# Patient Record
Sex: Male | Born: 1995 | Race: White | Hispanic: No | Marital: Single | Smoking: Never smoker
Health system: Southern US, Community
[De-identification: ages and names within clinical notes are randomized; demographics above are authoritative.]

## PROBLEM LIST (undated history)

## (undated) DIAGNOSIS — M24019 Loose body in unspecified shoulder: Secondary | ICD-10-CM

## (undated) DIAGNOSIS — M24112 Other articular cartilage disorders, left shoulder: Secondary | ICD-10-CM

## (undated) DIAGNOSIS — J3489 Other specified disorders of nose and nasal sinuses: Secondary | ICD-10-CM

## (undated) DIAGNOSIS — G43909 Migraine, unspecified, not intractable, without status migrainosus: Secondary | ICD-10-CM

## (undated) DIAGNOSIS — S43006A Unspecified dislocation of unspecified shoulder joint, initial encounter: Secondary | ICD-10-CM

## (undated) HISTORY — PX: THYROGLOSSAL DUCT CYST: SHX297

## (undated) HISTORY — PX: INGUINAL HERNIA REPAIR: SHX194

---
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
## 2016-04-21 DIAGNOSIS — M24112 Other articular cartilage disorders, left shoulder: Secondary | ICD-10-CM

## 2016-04-21 DIAGNOSIS — S43006A Unspecified dislocation of unspecified shoulder joint, initial encounter: Secondary | ICD-10-CM

## 2016-04-21 DIAGNOSIS — M24019 Loose body in unspecified shoulder: Secondary | ICD-10-CM

## 2016-04-21 HISTORY — DX: Unspecified dislocation of unspecified shoulder joint, initial encounter: S43.006A

## 2016-04-21 HISTORY — DX: Other articular cartilage disorders, left shoulder: M24.112

## 2016-04-21 HISTORY — DX: Loose body in unspecified shoulder: M24.019

## 2016-05-15 ENCOUNTER — Encounter (HOSPITAL_BASED_OUTPATIENT_CLINIC_OR_DEPARTMENT_OTHER): Payer: Self-pay | Admitting: *Deleted

## 2016-05-15 DIAGNOSIS — J3489 Other specified disorders of nose and nasal sinuses: Secondary | ICD-10-CM

## 2016-05-15 HISTORY — DX: Other specified disorders of nose and nasal sinuses: J34.89

## 2016-05-20 NOTE — H&P (Signed)
Douglas Dixon is a 20 year old, new patient to me, who comes to the office with concerns about his left shoulder.  He was slack lining over the weekend when he awkwardly fell and his shoulder popped out.  He got it back in.  He reached up overhead and it came out a second time and stayed out.  He was seen in the emergency room where he was given anesthesia and a closed reduction was performed.  [place]  He is here to discuss his shoulder dilemma.  No previous history of instability.  Some generalized paresthesias down his arm, but they seem to be improving.  Health upon review is otherwise unremarkable.  On no current medications.  He is allergic to penicillin and sulfa.  No history of diabetes.  Past surgical history positive for thyroglossal cyst excision, herniorrhaphy, tonsillectomy.  Family is significant for diabetes and hypertension.  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  OBJECTIVE: Height 5'8".  Weight 135 pounds.  Lungs clear to auscultation bilaterally.  Heart sounds normal. Exam of the left shoulder shows positive anterior apprehension.  Negative sulcus.  Upper extremity neurologically intact.  Exam of cervical spine unremarkable.    X-RAYS: Three views of the shoulder show no acute adverse bony features.  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  PLAN: At this point the definitive treatment includes left shoulder arthroscopic bankart reconstruction with excision loose body.  Risks, benefits and possible complications reviewed.  Rehab and recovery time discussed.  All questions were answered.  Paperwork complete.

## 2016-05-22 ENCOUNTER — Ambulatory Visit (HOSPITAL_BASED_OUTPATIENT_CLINIC_OR_DEPARTMENT_OTHER)
Admission: RE | Admit: 2016-05-22 | Discharge: 2016-05-23 | Disposition: A | Discharge status: Home / Self Care | Payer: BLUE CROSS/BLUE SHIELD | Source: Ambulatory Visit | Attending: Orthopedic Surgery | Admitting: Orthopedic Surgery

## 2016-05-22 ENCOUNTER — Encounter (HOSPITAL_BASED_OUTPATIENT_CLINIC_OR_DEPARTMENT_OTHER): Payer: Self-pay | Admitting: Anesthesiology

## 2016-05-22 ENCOUNTER — Ambulatory Visit (HOSPITAL_BASED_OUTPATIENT_CLINIC_OR_DEPARTMENT_OTHER): Payer: BLUE CROSS/BLUE SHIELD | Admitting: Anesthesiology

## 2016-05-22 ENCOUNTER — Encounter (HOSPITAL_BASED_OUTPATIENT_CLINIC_OR_DEPARTMENT_OTHER)
Admission: RE | Disposition: A | Discharge status: Home / Self Care | Payer: Self-pay | Source: Ambulatory Visit | Attending: Orthopedic Surgery

## 2016-05-22 DIAGNOSIS — Z880 Allergy status to penicillin: Secondary | ICD-10-CM | POA: Insufficient documentation

## 2016-05-22 DIAGNOSIS — M21822 Other specified acquired deformities of left upper arm: Secondary | ICD-10-CM | POA: Diagnosis present

## 2016-05-22 DIAGNOSIS — M24412 Recurrent dislocation, left shoulder: Secondary | ICD-10-CM | POA: Diagnosis not present

## 2016-05-22 DIAGNOSIS — S42292A Other displaced fracture of upper end of left humerus, initial encounter for closed fracture: Secondary | ICD-10-CM | POA: Diagnosis present

## 2016-05-22 DIAGNOSIS — W19XXXA Unspecified fall, initial encounter: Secondary | ICD-10-CM | POA: Insufficient documentation

## 2016-05-22 DIAGNOSIS — S43085A Other dislocation of left shoulder joint, initial encounter: Secondary | ICD-10-CM | POA: Diagnosis present

## 2016-05-22 HISTORY — DX: Migraine, unspecified, not intractable, without status migrainosus: G43.909

## 2016-05-22 HISTORY — DX: Loose body in unspecified shoulder: M24.019

## 2016-05-22 HISTORY — DX: Other articular cartilage disorders, left shoulder: M24.112

## 2016-05-22 HISTORY — DX: Other specified disorders of nose and nasal sinuses: J34.89

## 2016-05-22 HISTORY — PX: CHONDROPLASTY: SHX5177

## 2016-05-22 HISTORY — DX: Unspecified dislocation of unspecified shoulder joint, initial encounter: S43.006A

## 2016-05-22 HISTORY — PX: SHOULDER ARTHROSCOPY WITH BANKART REPAIR: SHX5673

## 2016-05-22 SURGERY — SHOULDER ARTHROSCOPY WITH BANKART REPAIR
Anesthesia: General | Site: Shoulder | Laterality: Left

## 2016-05-22 MED ORDER — HYDROMORPHONE HCL 1 MG/ML IJ SOLN
0.2500 mg | INTRAMUSCULAR | Status: DC | PRN
  Administered 2016-05-22 (×4): 0.5 mg via INTRAVENOUS

## 2016-05-22 MED ORDER — MIDAZOLAM HCL 2 MG/2ML IJ SOLN
1.0000 mg | INTRAMUSCULAR | Status: DC | PRN
  Administered 2016-05-22: 2 mg via INTRAVENOUS

## 2016-05-22 MED ORDER — SCOPOLAMINE 1 MG/3DAYS TD PT72: 1.0000 | MEDICATED_PATCH | Freq: Once | TRANSDERMAL | Status: DC | PRN

## 2016-05-22 MED ORDER — CHLORHEXIDINE GLUCONATE 4 % EX LIQD: 60.0000 mL | Freq: Once | CUTANEOUS | Status: DC

## 2016-05-22 MED ORDER — LACTATED RINGERS IV SOLN
INTRAVENOUS | Status: DC
  Administered 2016-05-22 (×2): via INTRAVENOUS

## 2016-05-22 MED ORDER — METOCLOPRAMIDE HCL 5 MG PO TABS: 5.0000 mg | ORAL_TABLET | Freq: Three times a day (TID) | ORAL | Status: DC | PRN

## 2016-05-22 MED ORDER — METHOCARBAMOL 500 MG PO TABS
500.0000 mg | ORAL_TABLET | Freq: Four times a day (QID) | ORAL | Status: DC | PRN
  Administered 2016-05-22 – 2016-05-23 (×3): 500 mg via ORAL
  Filled 2016-05-22 (×3): qty 1

## 2016-05-22 MED ORDER — ONDANSETRON HCL 4 MG PO TABS
4.0000 mg | ORAL_TABLET | Freq: Four times a day (QID) | ORAL | Status: DC | PRN
  Administered 2016-05-22: 4 mg via ORAL
  Filled 2016-05-22: qty 1

## 2016-05-22 MED ORDER — SUCCINYLCHOLINE CHLORIDE 20 MG/ML IJ SOLN
INTRAMUSCULAR | Status: DC | PRN
  Administered 2016-05-22: 120 mg via INTRAVENOUS

## 2016-05-22 MED ORDER — OXYCODONE HCL 5 MG/5ML PO SOLN: 5.0000 mg | Freq: Once | ORAL | Status: DC | PRN

## 2016-05-22 MED ORDER — OXYCODONE-ACETAMINOPHEN 5-325 MG PO TABS: 1.0000 | ORAL_TABLET | ORAL | Status: DC | PRN

## 2016-05-22 MED ORDER — CLINDAMYCIN PHOSPHATE 900 MG/50ML IV SOLN
INTRAVENOUS | Status: AC
  Filled 2016-05-22: qty 50

## 2016-05-22 MED ORDER — MIDAZOLAM HCL 2 MG/2ML IJ SOLN
INTRAMUSCULAR | Status: AC
  Filled 2016-05-22: qty 2

## 2016-05-22 MED ORDER — ESMOLOL HCL 100 MG/10ML IV SOLN
INTRAVENOUS | Status: AC
  Filled 2016-05-22: qty 10

## 2016-05-22 MED ORDER — LIDOCAINE 2% (20 MG/ML) 5 ML SYRINGE
INTRAMUSCULAR | Status: DC | PRN
  Administered 2016-05-22: 100 mg via INTRAVENOUS

## 2016-05-22 MED ORDER — SUCCINYLCHOLINE CHLORIDE 200 MG/10ML IV SOSY
PREFILLED_SYRINGE | INTRAVENOUS | Status: AC
  Filled 2016-05-22: qty 10

## 2016-05-22 MED ORDER — SODIUM CHLORIDE 0.9 % IR SOLN
Status: DC | PRN
  Administered 2016-05-22: 7500 mL

## 2016-05-22 MED ORDER — OXYCODONE-ACETAMINOPHEN 5-325 MG PO TABS
1.0000 | ORAL_TABLET | ORAL | Status: DC | PRN
  Administered 2016-05-22 – 2016-05-23 (×4): 2 via ORAL
  Filled 2016-05-22 (×4): qty 2

## 2016-05-22 MED ORDER — PROPOFOL 10 MG/ML IV BOLUS
INTRAVENOUS | Status: DC | PRN
  Administered 2016-05-22: 200 mg via INTRAVENOUS

## 2016-05-22 MED ORDER — BUPIVACAINE HCL (PF) 0.5 % IJ SOLN
INTRAMUSCULAR | Status: AC
  Filled 2016-05-22: qty 30

## 2016-05-22 MED ORDER — METOCLOPRAMIDE HCL 5 MG/ML IJ SOLN: 5.0000 mg | Freq: Three times a day (TID) | INTRAMUSCULAR | Status: DC | PRN

## 2016-05-22 MED ORDER — ONDANSETRON HCL 4 MG PO TABS: 4.0000 mg | ORAL_TABLET | Freq: Four times a day (QID) | ORAL | Status: DC | PRN

## 2016-05-22 MED ORDER — OXYCODONE HCL 5 MG PO TABS: 5.0000 mg | ORAL_TABLET | Freq: Once | ORAL | Status: DC | PRN

## 2016-05-22 MED ORDER — ESMOLOL HCL 100 MG/10ML IV SOLN
INTRAVENOUS | Status: DC | PRN
  Administered 2016-05-22 (×2): 20 mg via INTRAVENOUS

## 2016-05-22 MED ORDER — ONDANSETRON HCL 4 MG/2ML IJ SOLN
INTRAMUSCULAR | Status: AC
  Filled 2016-05-22: qty 2

## 2016-05-22 MED ORDER — HYDROMORPHONE HCL 1 MG/ML IJ SOLN: 0.5000 mg | INTRAMUSCULAR | Status: DC | PRN

## 2016-05-22 MED ORDER — ONDANSETRON HCL 4 MG/2ML IJ SOLN
INTRAMUSCULAR | Status: DC | PRN
  Administered 2016-05-22: 4 mg via INTRAVENOUS

## 2016-05-22 MED ORDER — DEXAMETHASONE SODIUM PHOSPHATE 10 MG/ML IJ SOLN
INTRAMUSCULAR | Status: AC
  Filled 2016-05-22: qty 1

## 2016-05-22 MED ORDER — FENTANYL CITRATE (PF) 100 MCG/2ML IJ SOLN
50.0000 ug | INTRAMUSCULAR | Status: DC | PRN
  Administered 2016-05-22: 100 ug via INTRAVENOUS

## 2016-05-22 MED ORDER — KETOROLAC TROMETHAMINE 30 MG/ML IJ SOLN
INTRAMUSCULAR | Status: DC | PRN
  Administered 2016-05-22: 30 mg via INTRAVENOUS

## 2016-05-22 MED ORDER — ONDANSETRON HCL 4 MG/2ML IJ SOLN: 4.0000 mg | Freq: Four times a day (QID) | INTRAMUSCULAR | Status: DC | PRN

## 2016-05-22 MED ORDER — KETOROLAC TROMETHAMINE 30 MG/ML IJ SOLN
INTRAMUSCULAR | Status: AC
  Filled 2016-05-22: qty 1

## 2016-05-22 MED ORDER — FENTANYL CITRATE (PF) 100 MCG/2ML IJ SOLN
INTRAMUSCULAR | Status: AC
  Filled 2016-05-22: qty 2

## 2016-05-22 MED ORDER — HYDROMORPHONE HCL 1 MG/ML IJ SOLN
INTRAMUSCULAR | Status: AC
  Filled 2016-05-22: qty 1

## 2016-05-22 MED ORDER — BUPIVACAINE HCL (PF) 0.5 % IJ SOLN
INTRAMUSCULAR | Status: DC | PRN
  Administered 2016-05-22: 20 mL via INTRA_ARTICULAR

## 2016-05-22 MED ORDER — METHOCARBAMOL 1000 MG/10ML IJ SOLN: 500.0000 mg | Freq: Four times a day (QID) | INTRAVENOUS | Status: DC | PRN

## 2016-05-22 MED ORDER — CLINDAMYCIN PHOSPHATE 900 MG/50ML IV SOLN
900.0000 mg | INTRAVENOUS | Status: AC
  Administered 2016-05-22: 900 mg via INTRAVENOUS

## 2016-05-22 MED ORDER — CEFAZOLIN SODIUM-DEXTROSE 2-4 GM/100ML-% IV SOLN
INTRAVENOUS | Status: AC
  Filled 2016-05-22: qty 100

## 2016-05-22 MED ORDER — SODIUM CHLORIDE 0.9 % IV SOLN: INTRAVENOUS | Status: DC

## 2016-05-22 MED ORDER — GLYCOPYRROLATE 0.2 MG/ML IJ SOLN: 0.2000 mg | Freq: Once | INTRAMUSCULAR | Status: DC | PRN

## 2016-05-22 MED ORDER — LACTATED RINGERS IV SOLN: INTRAVENOUS | Status: DC

## 2016-05-22 MED ORDER — LIDOCAINE 2% (20 MG/ML) 5 ML SYRINGE
INTRAMUSCULAR | Status: AC
  Filled 2016-05-22: qty 5

## 2016-05-22 MED ORDER — SODIUM CHLORIDE 0.9 % IJ SOLN
3.0000 mL | Freq: Three times a day (TID) | INTRAMUSCULAR | Status: DC
Start: 2016-05-22 — End: 2016-05-23
  Administered 2016-05-22: 3 mL via INTRAVENOUS
  Filled 2016-05-22: qty 10

## 2016-05-22 MED ORDER — ONDANSETRON HCL 4 MG PO TABS: 4.0000 mg | ORAL_TABLET | Freq: Three times a day (TID) | ORAL | Status: DC | PRN

## 2016-05-22 SURGICAL SUPPLY — 99 items
ANCH SUT SHRT 12.5 CANN EYLT (Anchor) ×3 IMPLANT
ANCHOR SUT BIOCOMP LK 2.9X12.5 (Anchor) ×6 IMPLANT
APL SKNCLS STERI-STRIP NONHPOA (GAUZE/BANDAGES/DRESSINGS)
BAG DECANTER FOR FLEXI CONT (MISCELLANEOUS) IMPLANT
BANDAGE ACE 6X5 VEL STRL LF (GAUZE/BANDAGES/DRESSINGS) IMPLANT
BENZOIN TINCTURE PRP APPL 2/3 (GAUZE/BANDAGES/DRESSINGS) IMPLANT
BLADE CUTTER GATOR 3.5 (BLADE) ×3 IMPLANT
BLADE CUTTER MENIS 5.5 (BLADE) IMPLANT
BLADE GREAT WHITE 4.2 (BLADE) ×2 IMPLANT
BLADE GREAT WHITE 4.2MM (BLADE) ×1
BLADE MINI RND TIP GREEN BEAV (BLADE) IMPLANT
BLADE SURG 10 STRL SS (BLADE) ×1 IMPLANT
BLADE SURG 15 STRL LF DISP TIS (BLADE) ×1 IMPLANT
BLADE SURG 15 STRL SS (BLADE)
BUR OVAL 6.0 (BURR) ×1 IMPLANT
CANNULA 5.75X71 LONG (CANNULA) IMPLANT
CANNULA DRY DOC 8X75 (CANNULA) IMPLANT
CANNULA TWIST IN 8.25X7CM (CANNULA) ×2 IMPLANT
CANNULA TWIST IN 8.25X9CM (CANNULA) IMPLANT
CLEANER CAUTERY TIP 5X5 PAD (MISCELLANEOUS) ×1 IMPLANT
CLOSURE WOUND 1/2 X4 (GAUZE/BANDAGES/DRESSINGS)
CUFF TOURNIQUET SINGLE 34IN LL (TOURNIQUET CUFF) IMPLANT
DECANTER SPIKE VIAL GLASS SM (MISCELLANEOUS) ×2 IMPLANT
DRAPE EXTREMITY T 121X128X90 (DRAPE) ×3 IMPLANT
DRAPE STERI 35X30 U-POUCH (DRAPES) ×3 IMPLANT
DRAPE SURG 17X23 STRL (DRAPES) IMPLANT
DRAPE U-SHAPE 47X51 STRL (DRAPES) ×3 IMPLANT
DRAPE U-SHAPE 76X120 STRL (DRAPES) ×6 IMPLANT
DRSG PAD ABDOMINAL 8X10 ST (GAUZE/BANDAGES/DRESSINGS) ×3 IMPLANT
DURAPREP 26ML APPLICATOR (WOUND CARE) ×3 IMPLANT
ELECT MENISCUS 165MM 90D (ELECTRODE) ×3 IMPLANT
ELECT REM PT RETURN 9FT ADLT (ELECTROSURGICAL) ×3
ELECTRODE REM PT RTRN 9FT ADLT (ELECTROSURGICAL) ×1 IMPLANT
GAUZE SPONGE 4X4 12PLY STRL (GAUZE/BANDAGES/DRESSINGS) ×5 IMPLANT
GAUZE SPONGE 4X4 16PLY XRAY LF (GAUZE/BANDAGES/DRESSINGS) IMPLANT
GAUZE XEROFORM 1X8 LF (GAUZE/BANDAGES/DRESSINGS) ×3 IMPLANT
GLOVE BIOGEL PI IND STRL 7.0 (GLOVE) ×1 IMPLANT
GLOVE BIOGEL PI INDICATOR 7.0 (GLOVE) ×6
GLOVE ECLIPSE 6.5 STRL STRAW (GLOVE) ×2 IMPLANT
GLOVE ECLIPSE 7.0 STRL STRAW (GLOVE) ×3 IMPLANT
GLOVE SURG ORTHO 8.0 STRL STRW (GLOVE) ×3 IMPLANT
GOWN STRL REUS W/ TWL LRG LVL3 (GOWN DISPOSABLE) ×2 IMPLANT
GOWN STRL REUS W/ TWL XL LVL3 (GOWN DISPOSABLE) ×1 IMPLANT
GOWN STRL REUS W/TWL LRG LVL3 (GOWN DISPOSABLE) ×6
GOWN STRL REUS W/TWL XL LVL3 (GOWN DISPOSABLE) ×3
IMMOBILIZER SHOULDER FOAM XLGE (SOFTGOODS) IMPLANT
KIT PUSHLOCK 2.9 HIP (KITS) ×2 IMPLANT
LASSO 90 CVE QUICKPAS (DISPOSABLE) ×5 IMPLANT
LOOP 2 FIBERLINK CLOSED (SUTURE) IMPLANT
MANIFOLD NEPTUNE II (INSTRUMENTS) ×3 IMPLANT
NDL MAYO TROCAR (NEEDLE) IMPLANT
NDL SUT 6 .5 CRC .975X.05 MAYO (NEEDLE) IMPLANT
NEEDLE MAYO TAPER (NEEDLE)
NEEDLE MAYO TROCAR (NEEDLE) IMPLANT
NS IRRIG 1000ML POUR BTL (IV SOLUTION) ×3 IMPLANT
PACK ARTHROSCOPY DSU (CUSTOM PROCEDURE TRAY) ×3 IMPLANT
PACK BASIN DAY SURGERY FS (CUSTOM PROCEDURE TRAY) ×3 IMPLANT
PAD CLEANER CAUTERY TIP 5X5 (MISCELLANEOUS) ×2
PADDING CAST COTTON 6X4 STRL (CAST SUPPLIES) IMPLANT
PENCIL BUTTON HOLSTER BLD 10FT (ELECTRODE) ×3 IMPLANT
SET ARTHROSCOPY TUBING (MISCELLANEOUS) ×3
SET ARTHROSCOPY TUBING LN (MISCELLANEOUS) ×1 IMPLANT
SLEEVE SCD COMPRESS KNEE MED (MISCELLANEOUS) IMPLANT
SLING ARM FOAM STRAP LRG (SOFTGOODS) IMPLANT
SLING ARM IMMOBILIZER LRG (SOFTGOODS) ×2 IMPLANT
SLING ARM IMMOBILIZER MED (SOFTGOODS) IMPLANT
SLING ARM MED ADULT FOAM STRAP (SOFTGOODS) IMPLANT
SLING ARM XL FOAM STRAP (SOFTGOODS) IMPLANT
SPONGE LAP 18X18 X RAY DECT (DISPOSABLE) ×3 IMPLANT
SPONGE LAP 4X18 X RAY DECT (DISPOSABLE) ×3 IMPLANT
STAPLER VISISTAT 35W (STAPLE) IMPLANT
STRIP CLOSURE SKIN 1/2X4 (GAUZE/BANDAGES/DRESSINGS) IMPLANT
SUCTION FRAZIER HANDLE 10FR (MISCELLANEOUS)
SUCTION TUBE FRAZIER 10FR DISP (MISCELLANEOUS) IMPLANT
SUT ETHIBOND 2 OS 4 DA (SUTURE) IMPLANT
SUT ETHILON 2 0 FS 18 (SUTURE) IMPLANT
SUT ETHILON 3 0 PS 1 (SUTURE) ×2 IMPLANT
SUT FIBERWIRE #2 38 T-5 BLUE (SUTURE)
SUT MNCRL AB 3-0 PS2 18 (SUTURE) IMPLANT
SUT RETRIEVER MED (INSTRUMENTS) IMPLANT
SUT VIC AB 0 CT1 27 (SUTURE)
SUT VIC AB 0 CT1 27XBRD ANBCTR (SUTURE) IMPLANT
SUT VIC AB 0 SH 27 (SUTURE) IMPLANT
SUT VIC AB 1 CT1 27 (SUTURE)
SUT VIC AB 1 CT1 27XBRD ANBCTR (SUTURE) IMPLANT
SUT VIC AB 2-0 SH 27 (SUTURE) ×6
SUT VIC AB 2-0 SH 27XBRD (SUTURE) ×2 IMPLANT
SUT VIC AB 3-0 FS2 27 (SUTURE) IMPLANT
SUTURE FIBERWR #2 38 T-5 BLUE (SUTURE) IMPLANT
SYR BULB 3OZ (MISCELLANEOUS) ×3 IMPLANT
TAPE HYPAFIX 4 X10 (GAUZE/BANDAGES/DRESSINGS) ×3 IMPLANT
TAPE LABRALWHITE 1.5X36 (TAPE) ×2 IMPLANT
TAPE SUT LABRALTAP WHT/BLK (SUTURE) ×4 IMPLANT
TOWEL OR 17X24 6PK STRL BLUE (TOWEL DISPOSABLE) ×3 IMPLANT
TUBE CONNECTING 20'X1/4 (TUBING)
TUBE CONNECTING 20X1/4 (TUBING) IMPLANT
UNDERPAD 30X30 (UNDERPADS AND DIAPERS) ×1 IMPLANT
WATER STERILE IRR 1000ML POUR (IV SOLUTION) ×3 IMPLANT
YANKAUER SUCT BULB TIP NO VENT (SUCTIONS) ×3 IMPLANT

## 2016-05-22 NOTE — Discharge Instructions (Signed)
Care After Instructions Refer to this sheet in the next few weeks. These discharge instructions provide you with general information on caring for yourself after you leave the hospital. Your caregiver may also give you specific instructions. Your treatment has been planned according to the most current medical practices available, but unavoidable complications sometimes occur. If you have any problems or questions after discharge, please call your caregiver. HOME INSTRUCTIONS You may resume a normal diet and activities as directed.  Take showers instead of baths until informed otherwise.  Change bandages (dressings) in 3 days.  Swab wounds daily with betadine.  Wash shoulder with soap and water.  Pat dry.  Cover wounds with bandaids. Only take over-the-counter or prescription medicines for pain, discomfort, or fever as directed by your caregiver.  Wear your sling for the next 6 weeks unless otherwise instructed. Eat a well-balanced diet.  Avoid lifting or driving until you are instructed otherwise.  Make an appointment to see your caregiver for stitches (suture) or staple removal one week after surgery.   SEEK MEDICAL CARE IF: You have swelling of your calf or leg.  You develop shortness of breath or chest pain.  You have redness, swelling, or increasing pain in the wound.  There is pus or any unusual drainage coming from the surgical site.  You notice a bad smell coming from the surgical site or dressing.  The surgical site breaks open after sutures or staples have been removed.  There is persistent bleeding from the suture or staple line.  You are getting worse or are not improving.  You have any other questions or concerns.  SEEK IMMEDIATE MEDICAL CARE IF:  You have a fever greater than 101 You develop a rash.  You have difficulty breathing.  You develop any reaction or side effects to medicines given.  Your knee motion is decreasing rather than improving.  MAKE SURE YOU:    Understand these instructions.  Will watch your condition.  Will get help right away if you are not doing well or get worse.     Post Anesthesia Home Care Instructions  Activity: Get plenty of rest for the remainder of the day. A responsible adult should stay with you for 24 hours following the procedure.  For the next 24 hours, DO NOT: -Drive a car -Advertising copywriterperate machinery -Drink alcoholic beverages -Take any medication unless instructed by your physician -Make any legal decisions or sign important papers.  Meals: Start with liquid foods such as gelatin or soup. Progress to regular foods as tolerated. Avoid greasy, spicy, heavy foods. If nausea and/or vomiting occur, drink only clear liquids until the nausea and/or vomiting subsides. Call your physician if vomiting continues.  Special Instructions/Symptoms: Your throat may feel dry or sore from the anesthesia or the breathing tube placed in your throat during surgery. If this causes discomfort, gargle with warm salt water. The discomfort should disappear within 24 hours.  If you had a scopolamine patch placed behind your ear for the management of post- operative nausea and/or vomiting:  1. The medication in the patch is effective for 72 hours, after which it should be removed.  Wrap patch in a tissue and discard in the trash. Wash hands thoroughly with soap and water. 2. You may remove the patch earlier than 72 hours if you experience unpleasant side effects which may include dry mouth, dizziness or visual disturbances. 3. Avoid touching the patch. Wash your hands with soap and water after contact with the patch.

## 2016-05-22 NOTE — Interval H&P Note (Signed)
History and Physical Interval Note:  05/22/2016 7:30 AM  Douglas Dixon  has presented today for surgery, with the diagnosis of Unspecified dislocation of unspecified shoulder joint, initial encounter left, other articular cartilage disorders, left shoulder, loose body in left shoulder  S43.006A, M24.112, M24.012  The various methods of treatment have been discussed with the patient and family. After consideration of risks, benefits and other options for treatment, the patient has consented to  Procedure(s): LEFT SHOULDER ARTHROSCOPY WITH DEBRIDEMENT, BANKART REPAIR (Left) LOOSE BODY EXCISION (Left) as a surgical intervention .  The patient's history has been reviewed, patient examined, no change in status, stable for surgery.  I have reviewed the patient's chart and labs.  Questions were answered to the patient's satisfaction.     Loreta Aveaniel F Ivet Guerrieri

## 2016-05-22 NOTE — Anesthesia Preprocedure Evaluation (Signed)
Anesthesia Evaluation  Patient identified by MRN, date of birth, ID band Patient awake    Reviewed: Allergy & Precautions, NPO status , Patient's Chart, lab work & pertinent test results  Airway Mallampati: II   Neck ROM: full    Dental   Pulmonary neg pulmonary ROS,  breath sounds clear to auscultation        Cardiovascular negative cardio ROS  Rhythm:regular Rate:Normal     Neuro/Psych  Headaches,    GI/Hepatic   Endo/Other    Renal/GU      Musculoskeletal   Abdominal   Peds  Hematology   Anesthesia Other Findings   Reproductive/Obstetrics                             Anesthesia Physical Anesthesia Plan  ASA: I  Anesthesia Plan: General   Post-op Pain Management:    Induction: Intravenous  Airway Management Planned: Oral ETT  Additional Equipment:   Intra-op Plan:   Post-operative Plan: Extubation in OR  Informed Consent: I have reviewed the patients History and Physical, chart, labs and discussed the procedure including the risks, benefits and alternatives for the proposed anesthesia with the patient or authorized representative who has indicated his/her understanding and acceptance.     Plan Discussed with: CRNA, Anesthesiologist and Surgeon  Anesthesia Plan Comments:         Anesthesia Quick Evaluation  

## 2016-05-22 NOTE — Anesthesia Postprocedure Evaluation (Signed)
Anesthesia Post Note  Patient: Douglas Dixon  Procedure(s) Performed: Procedure(s) (LRB): LEFT SHOULDER ARTHROSCOPY WITH DEBRIDEMENT, BANKART REPAIR, EXCISION OF LOOSE BODY, CHONDROPLASTY (Left) CHONDROPLASTY (Left)  Patient location during evaluation: PACU Anesthesia Type: General Level of consciousness: awake and alert and patient cooperative Pain management: pain level controlled Vital Signs Assessment: post-procedure vital signs reviewed and stable Respiratory status: spontaneous breathing and respiratory function stable Cardiovascular status: stable Anesthetic complications: no    Last Vitals:  Filed Vitals:   05/22/16 1330 05/22/16 1345  BP: 136/94 141/94  Pulse: 71 71  Temp:    Resp: 16 14    Last Pain:  Filed Vitals:   05/22/16 1358  PainSc: 5                  Maleigha Colvard S

## 2016-05-22 NOTE — Anesthesia Procedure Notes (Signed)
Procedure Name: Intubation Date/Time: 05/22/2016 11:33 AM Performed by: Burna CashONRAD, Ervie Mccard C Pre-anesthesia Checklist: Patient identified, Emergency Drugs available, Suction available and Patient being monitored Patient Re-evaluated:Patient Re-evaluated prior to inductionOxygen Delivery Method: Circle system utilized Preoxygenation: Pre-oxygenation with 100% oxygen Intubation Type: IV induction Ventilation: Mask ventilation without difficulty Laryngoscope Size: Miller and 2 Grade View: Grade I Tube type: Oral Tube size: 8.0 mm Number of attempts: 1 Airway Equipment and Method: Stylet and Oral airway Placement Confirmation: ETT inserted through vocal cords under direct vision,  positive ETCO2 and breath sounds checked- equal and bilateral Secured at: 21 cm Tube secured with: Tape Dental Injury: Teeth and Oropharynx as per pre-operative assessment

## 2016-05-22 NOTE — Transfer of Care (Signed)
Immediate Anesthesia Transfer of Care Note  Patient: Beatrice Lecherhillip C Dykema  Procedure(s) Performed: Procedure(s): LEFT SHOULDER ARTHROSCOPY WITH DEBRIDEMENT, BANKART REPAIR, EXCISION OF LOOSE BODY, CHONDROPLASTY (Left) CHONDROPLASTY (Left)  Patient Location: PACU  Anesthesia Type:General  Level of Consciousness: sedated  Airway & Oxygen Therapy: Patient Spontanous Breathing and Patient connected to face mask oxygen  Post-op Assessment: Report given to RN and Post -op Vital signs reviewed and stable  Post vital signs: Reviewed and stable  Last Vitals:  Filed Vitals:   05/22/16 1250 05/22/16 1251  BP:    Pulse: 81 82  Temp:    Resp:  13    Last Pain:  Filed Vitals:   05/22/16 1252  PainSc: 3          Complications: No apparent anesthesia complications

## 2016-05-23 ENCOUNTER — Encounter (HOSPITAL_BASED_OUTPATIENT_CLINIC_OR_DEPARTMENT_OTHER): Payer: Self-pay | Admitting: Orthopedic Surgery

## 2016-05-23 DIAGNOSIS — M24412 Recurrent dislocation, left shoulder: Secondary | ICD-10-CM | POA: Diagnosis not present

## 2016-05-23 NOTE — Op Note (Signed)
NAMKathrene Dixon:  Gordon, PHILIP                ACCOUNT NO.:  [id number]  MEDICAL RECORD NO.:  [id number]  LOCATION:                                 FACILITY:  PHYSICIAN:  Loreta Aveaniel F. Murphy, M.D. DATE OF BIRTH:  02/26/1996  DATE OF PROCEDURE:  05/22/2016 DATE OF DISCHARGE:                              OPERATIVE REPORT   PREOPERATIVE DIAGNOSES:  Left shoulder traumatic anteroinferior dislocation.  Bankart lesion and Hill-Sachs lesion.  Loose body.  POSTOPERATIVE DIAGNOSES:  Left shoulder traumatic anteroinferior dislocation.  Bankart lesion and Hill-Sachs lesion.  Loose body with one free loose body of cartilage in the gutter.  A partially-tethered chondral loose body in the front of the glenoid adjacent to his Bankart lesion.  A little bit of fraying and tearing at the inferior labrum.  PROCEDURES:  Left shoulder exam under anesthesia, arthroscopy.  Removal of two loose bodies.  Chondroplasty of humeral head and front of the glenoid.  Debridement of labrum.  Arthroscopic-assisted Bankart reconstruction.  FiberWeave sutures x3.  PushLock anchors x3.  SURGEON:  Loreta Aveaniel F. Murphy, M.D.  ASSISTANT:  Mikey KirschnerLindsey Stanberry, PA, was present throughout the entire case and necessary for timely completion of procedure.  ANESTHESIA:  General.  BLOOD LOSS:  Minimal.  SPECIMENS:  None.  CULTURES:  None.  COMPLICATIONS:  None.  DRESSING:  Soft compressive shoulder immobilizer.  DESCRIPTION OF PROCEDURE:  The patient was brought to the operating room and after adequate anesthesia had been obtained, left shoulder examined. Obvious anterior instability, subluxed just examining him.  Stable posteriorly.  Full motion.  Placed in beach-chair position on the shoulder positioner, prepped and draped in usual sterile fashion.  Two portals; anterior and posterior.  Arthroscope was introduced, shoulder was distended and inspected.  Obvious Bankart lesion from 6-11 o'clock position.  This was mobilized.  At the  front of the glenoid at the 8 and 9 o'clock position, there were two chondral flaps lifted up a little bit above and behind them.  One completely unstable, removed this loose body.  The other one I could entrap and put back in place and I did the reconstruction.  Debrided the inferior labrum.  Rotator cuff, biceps tendon all looked good.  Chondral loose body found on the gutter removed.  Chondroplasty to a stable surface of the Hill-Sachs lesion. Through the anterior portal, cannula placed.  Capsular labral interface was captured at 6, 8 and 10 o'clock positions with a Captain hook device and LabralTape suture.  Anchored down the glenoid with PushLock anchors of 7, 9 and 11 o'clock.  Nice, firm reconstruction.  The middle anchor was placed to entrap and pull down the chondral flap.  Grade stability on completion.  Instruments and fluids were removed.  Portals were closed with nylon.  Sterile compressive dressing applied.  Shoulder immobilizer applied.  Anesthesia reversed.  Brought to the recovery room.  Tolerated the surgery well.  No complications.     Loreta Aveaniel F. Murphy, M.D.   ______________________________ Loreta Aveaniel F. Murphy, M.D.    DFM/MEDQ  D:  05/22/2016  T:  05/23/2016  Job:  [id number]

## 2020-01-26 ENCOUNTER — Ambulatory Visit: Payer: BC Managed Care – PPO | Admitting: Orthopedic Surgery

## 2020-01-26 ENCOUNTER — Other Ambulatory Visit: Payer: Self-pay

## 2020-01-26 ENCOUNTER — Ambulatory Visit: Payer: Self-pay

## 2020-01-26 DIAGNOSIS — M25512 Pain in left shoulder: Secondary | ICD-10-CM

## 2020-01-26 DIAGNOSIS — M25312 Other instability, left shoulder: Secondary | ICD-10-CM

## 2020-01-27 ENCOUNTER — Encounter: Payer: Self-pay | Admitting: Orthopedic Surgery

## 2020-01-27 NOTE — Progress Notes (Signed)
Office Visit Note   Patient: Douglas Dixon           Date of Birth: 04-Apr-1996           MRN: [id number] Visit Date: 01/26/2020 Requested by: Lemmie Evens, MD [address],  [place] [address] PCP: Lemmie Evens, MD  Subjective: Chief Complaint  Patient presents with  . Left Shoulder - Pain    HPI: Douglas Dixon is a patient with left shoulder pain.  He sustained a dislocation which was treated with left shoulder arthroscopy and Bankart repair 617.  He did well with that until February 2020.  Dislocated his shoulder while doing a forward flexion type movement.  This occurred when he was in [place].  It is subsequently dislocated about 20 times since then.  He has been doing exercises at home to try to help.  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  He also does horse and veterinary work.  He is not a smoker.  He always is able to self reduce the shoulder dislocation.  He also describes some milder right shoulder symptoms but no instability.              ROS: All systems reviewed are negative as they relate to the chief complaint within the history of present illness.  Patient denies  fevers or chills.   Assessment & Plan: Visit Diagnoses:  1. Left shoulder pain, unspecified chronicity   2. Instability of left shoulder joint     Plan: Impression is left shoulder recurrent instability.  I reviewed the operative note and 3 anchors were utilized that the 6:00 8:00 and 10 o'clock position.  Plain radiographs today do not show much of a significant Hill-Sachs lesion but he may have a slight amount of bone loss off the anterior glenoid.  He is heading for more surgery and whether or not that repeat arthroscopic stabilization or open J type stabilization really depends on the amount of bone loss.  To that end we will need left shoulder MRI arthrogram to evaluate the capsular labral complex anteriorly.  Also needs CT scan left shoulder to  evaluate the amount of bone loss present on the glenoid.  See him back after that study.  He will need radiographs of the right shoulder on return as well.  Follow-Up Instructions: No follow-ups on file.   Orders:  Orders Placed This Encounter  Procedures  . XR Shoulder Left  . CT SHOULDER LEFT WO CONTRAST  . MR Shoulder Left w/ contrast  . Arthrogram   No orders of the defined types were placed in this encounter.     Procedures: No procedures performed   Clinical Data: No additional findings.  Objective: Vital Signs: There were no vitals taken for this visit.  Physical Exam:   Constitutional: Patient appears well-developed HEENT:  Head: Normocephalic Eyes:EOM are normal Neck: Normal range of motion Cardiovascular: Normal rate Pulmonary/chest: Effort normal Neurologic: Patient is alert Skin: Skin is warm Psychiatric: Patient has normal mood and affect    Ortho Exam: Ortho exam demonstrates full active and passive range of motion of the right shoulder.  Has a little bit of grinding in that right shoulder but good rotator cuff strength and no instability.  On the left shoulder there is less than a centimeter sulcus sign but definitely has anterior greater than posterior instability.  Rotator cuff strength is good infraspinatus supraspinatus and subscap muscle testing.  Not much in the way of coarse grinding or crepitus with active or passive range of motion of that left shoulder.  Specialty Comments:  No specialty comments available.  Imaging: No results found.   PMFS History: Patient Active Problem List   Diagnosis Date Noted  . 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 Loose body in joint of shoulder region 04/2016   left  . Migraines   . Shoulder joint dislocation 04/2016   left  . Stuffy and runny nose 05/15/2016   clear drainage from nose, per pt.      No family history on file.  Past Surgical History:  Procedure Laterality Date  . CHONDROPLASTY Left 05/22/2016   Procedure: CHONDROPLASTY;  Surgeon: Loreta Ave, MD;  Location: [facility];  Service: Orthopedics;  Laterality: Left;  . INGUINAL HERNIA REPAIR    . SHOULDER ARTHROSCOPY WITH BANKART REPAIR Left 05/22/2016   Procedure: LEFT SHOULDER ARTHROSCOPY WITH DEBRIDEMENT, BANKART REPAIR, EXCISION OF LOOSE BODY, CHONDROPLASTY;  Surgeon: Loreta Ave, MD;  Location: [facility];  Service: Orthopedics;  Laterality: Left;  . THYROGLOSSAL DUCT CYST    . TONSILLECTOMY AND ADENOIDECTOMY  06/11/2001   Social History   Occupational History  . Not on file  Tobacco Use  . Smoking status: Never Smoker  . Smokeless tobacco: Never Used  Substance and Sexual Activity  . Alcohol use: Yes    Comment: occasionally  . Drug use: No  . Sexual activity: Not on file

## 2020-01-27 NOTE — Progress Notes (Signed)
faxed

## 2020-03-08 ENCOUNTER — Other Ambulatory Visit: Payer: Self-pay

## 2020-03-08 ENCOUNTER — Ambulatory Visit
Admission: RE | Admit: 2020-03-08 | Discharge: 2020-03-08 | Disposition: A | Discharge status: Home / Self Care | Payer: BC Managed Care – PPO | Source: Ambulatory Visit | Attending: Orthopedic Surgery | Admitting: Orthopedic Surgery

## 2020-03-08 ENCOUNTER — Other Ambulatory Visit: Payer: Self-pay | Admitting: Orthopedic Surgery

## 2020-03-08 DIAGNOSIS — M25512 Pain in left shoulder: Secondary | ICD-10-CM

## 2020-03-08 DIAGNOSIS — Z77018 Contact with and (suspected) exposure to other hazardous metals: Secondary | ICD-10-CM

## 2020-03-08 DIAGNOSIS — M25312 Other instability, left shoulder: Secondary | ICD-10-CM

## 2020-03-08 MED ORDER — IOPAMIDOL (ISOVUE-M 200) INJECTION 41%
15.0000 mL | Freq: Once | INTRAMUSCULAR | Status: AC
  Administered 2020-03-08: 15 mL via INTRA_ARTICULAR

## 2020-03-09 ENCOUNTER — Encounter: Payer: Self-pay | Admitting: Orthopedic Surgery

## 2020-03-12 ENCOUNTER — Ambulatory Visit: Payer: BC Managed Care – PPO | Admitting: Orthopedic Surgery

## 2020-03-12 ENCOUNTER — Other Ambulatory Visit: Payer: Self-pay

## 2020-03-12 ENCOUNTER — Encounter: Payer: Self-pay | Admitting: Orthopedic Surgery

## 2020-03-12 DIAGNOSIS — M25312 Other instability, left shoulder: Secondary | ICD-10-CM

## 2020-03-15 ENCOUNTER — Encounter: Payer: Self-pay | Admitting: Orthopedic Surgery

## 2020-03-15 NOTE — Progress Notes (Signed)
Office Visit Note   Patient: Douglas Dixon           Date of Birth: 1996/11/03           MRN: [id number] Visit Date: 03/12/2020 Requested by: Gareth Morgan, MD [address],  [place] [address] PCP: Gareth Morgan, MD  Subjective: Chief Complaint  Patient presents with  . Follow-up    HPI: Douglas Dixon is a patient 24 years old here for review of CT and MRI scan.  He had about 20 dislocations since June 2020.  He had previous arthroscopic Bankart repair done elsewhere in town in 2017.  Did have a bony fragment of the glenoid at that time.  Instability is much more than pain in terms of his presenting symptoms.  Does have some paresthesias on that left-hand side in digits 4 and 5.  He has had recurrent symptoms in this region since his recurrent instability.  He does do physical work.  He works with horses and dogs.  He is not a smoker.  He also does snowboarding and other considerably high risk activities.  MRI scan is reviewed.  He does have bony Bankart which did not heal.  No haggle lesion.  Very mild Hill-Sachs lesion.  No significant engaging Hill-Sachs lesion in the glenoid bony defect.  All in all the bony defect size is difficult to quantify but he does have anterior inferior bone deficiency and recurrent instability with minimal exertion.              ROS: All systems reviewed are negative as they relate to the chief complaint within the history of present illness.  Patient denies  fevers or chills.   Assessment & Plan: Visit Diagnoses: No diagnosis found.  Plan: Impression is recurrent instability with glenoid bony defect and failure of previous arthroscopic Bankart repair.  Op note is reviewed.  3 anchors were utilized but only 2 are visible on the MRI scan.  The bony fragment is about 1/2 cm in length and does narrow the inferior half of the glenoid itself.  It does not look like he has an engaging Hill-Sachs lesion.  Rotator cuff strength is good.  Also has a SLAP tear.   Recurrent labral pathology is present.  Discussed at length with Douglas Dixon operative treatment options for this problem.  They would essentially be recurrent Bankart repair versus open J procedure through the subscap split.  I did tell him he would likely lose some range of motion with that latter procedure.  Risk of instability higher with revision arthroscopic Bankart in the face of some bony deficiency.  We talked a long time about the advantages and disadvantages of each procedure.  In the [place] decided that because instability was such a major problem he would prefer to have the procedure which would give him the best Dixon at not having recurrent instability.  That would be for him J procedure with biceps tenodesis.  Risk and benefits are discussed include not limited to infection nerve vessel damage hardware failure potential need for more surgery as well as recurrent instability shoulder stiffness and development of arthritis.  Patient understands risk benefits and wishes to proceed.  All questions answered.  Follow-Up Instructions: No follow-ups on file.   Orders:  No orders of the defined types were placed in this encounter.  No orders of the defined types were placed in this encounter.     Procedures: No procedures performed   Clinical Data: No additional findings.  Objective: Vital Signs: There were no vitals taken for this visit.  Physical Exam:   Constitutional: Patient appears well-developed HEENT:  Head: Normocephalic Eyes:EOM are normal Neck: Normal range of motion Cardiovascular: Normal rate Pulmonary/chest: Effort normal Neurologic: Patient is alert Skin: Skin is warm Psychiatric: Patient has normal mood and affect    Ortho Exam: Ortho exam demonstrates good rotator cuff strength infraspinatus supraspinatus and subscap muscle testing.  He does have some anterior apprehension but no sulcus sign.  External rotation of 15 degrees of abduction is about 75 degrees  bilaterally.  Forward flexion is full.  He does have less than a centimeter sulcus sign on the left and 2+ anterior instability 1+ posterior instability.  Specialty Comments:  No specialty comments available.  Imaging: No results found.   PMFS History: Patient Active Problem List   Diagnosis Date Noted  . 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 Loose body in joint of shoulder region 04/2016   left  . Migraines   . Shoulder joint dislocation 04/2016   left  . Stuffy and runny nose 05/15/2016   clear drainage from nose, per pt.    No family history on file.  Past Surgical History:  Procedure Laterality Date  . CHONDROPLASTY Left 05/22/2016   Procedure: CHONDROPLASTY;  Surgeon: Ninetta Lights, MD;  Location: [facility];  Service: Orthopedics;  Laterality: Left;  . INGUINAL HERNIA REPAIR    . SHOULDER ARTHROSCOPY WITH BANKART REPAIR Left 05/22/2016   Procedure: LEFT SHOULDER ARTHROSCOPY WITH DEBRIDEMENT, BANKART REPAIR, EXCISION OF LOOSE BODY, CHONDROPLASTY;  Surgeon: Ninetta Lights, MD;  Location: [facility];  Service: Orthopedics;  Laterality: Left;  . THYROGLOSSAL DUCT CYST    . TONSILLECTOMY AND ADENOIDECTOMY  06/11/2001   Social History   Occupational History  . Not on file  Tobacco Use  . Smoking status: Never Smoker  . Smokeless tobacco: Never Used  Substance and Sexual Activity  . Alcohol use: Yes    Comment: occasionally  . Drug use: No  . Sexual activity: Not on file

## 2020-04-05 ENCOUNTER — Other Ambulatory Visit: Payer: Self-pay

## 2020-04-10 ENCOUNTER — Encounter (HOSPITAL_BASED_OUTPATIENT_CLINIC_OR_DEPARTMENT_OTHER): Payer: Self-pay | Admitting: Orthopedic Surgery

## 2020-04-10 ENCOUNTER — Other Ambulatory Visit: Payer: Self-pay

## 2020-04-13 ENCOUNTER — Encounter (HOSPITAL_BASED_OUTPATIENT_CLINIC_OR_DEPARTMENT_OTHER)
Admission: RE | Admit: 2020-04-13 | Discharge: 2020-04-13 | Disposition: A | Discharge status: Home / Self Care | Payer: BC Managed Care – PPO | Source: Ambulatory Visit | Attending: Orthopedic Surgery | Admitting: Orthopedic Surgery

## 2020-04-13 DIAGNOSIS — Z01812 Encounter for preprocedural laboratory examination: Secondary | ICD-10-CM | POA: Diagnosis not present

## 2020-04-13 LAB — URINALYSIS, ROUTINE W REFLEX MICROSCOPIC
Bilirubin Urine: NEGATIVE
Glucose, UA: NEGATIVE mg/dL
Hgb urine dipstick: NEGATIVE
Ketones, ur: NEGATIVE mg/dL
Leukocytes,Ua: NEGATIVE
Nitrite: NEGATIVE
Protein, ur: NEGATIVE mg/dL
Specific Gravity, Urine: 1.005 (ref 1.005–1.030)
pH: 7 (ref 5.0–8.0)

## 2020-04-13 LAB — CBC
HCT: 46.8 % (ref 39.0–52.0)
Hemoglobin: 15.6 g/dL (ref 13.0–17.0)
MCH: 29.3 pg (ref 26.0–34.0)
MCHC: 33.3 g/dL (ref 30.0–36.0)
MCV: 88 fL (ref 80.0–100.0)
Platelets: 235 10*3/uL (ref 150–400)
RBC: 5.32 MIL/uL (ref 4.22–5.81)
RDW: 12.2 % (ref 11.5–15.5)
WBC: 4.7 10*3/uL (ref 4.0–10.5)
nRBC: 0 % (ref 0.0–0.2)

## 2020-04-13 LAB — BASIC METABOLIC PANEL
Anion gap: 8 (ref 5–15)
BUN: 10 mg/dL (ref 6–20)
CO2: 28 mmol/L (ref 22–32)
Calcium: 9.7 mg/dL (ref 8.9–10.3)
Chloride: 104 mmol/L (ref 98–111)
Creatinine, Ser: 1 mg/dL (ref 0.61–1.24)
GFR calc Af Amer: >60 mL/min (ref >60)
GFR calc non Af Amer: >60 mL/min (ref >60)
Glucose, Bld: 93 mg/dL (ref 70–99)
Potassium: 4.2 mmol/L (ref 3.5–5.1)
Sodium: 140 mmol/L (ref 135–145)

## 2020-04-13 NOTE — Progress Notes (Signed)

## 2020-04-14 LAB — URINE CULTURE: Culture: NO GROWTH

## 2020-04-16 ENCOUNTER — Other Ambulatory Visit (HOSPITAL_COMMUNITY)
Admission: RE | Admit: 2020-04-16 | Discharge: 2020-04-16 | Disposition: A | Discharge status: Home / Self Care | Payer: BC Managed Care – PPO | Source: Ambulatory Visit | Attending: Orthopedic Surgery | Admitting: Orthopedic Surgery

## 2020-04-16 ENCOUNTER — Other Ambulatory Visit: Payer: Self-pay

## 2020-04-16 DIAGNOSIS — Z20822 Contact with and (suspected) exposure to covid-19: Secondary | ICD-10-CM | POA: Diagnosis not present

## 2020-04-16 DIAGNOSIS — Z01812 Encounter for preprocedural laboratory examination: Secondary | ICD-10-CM | POA: Insufficient documentation

## 2020-04-17 ENCOUNTER — Other Ambulatory Visit: Payer: Self-pay

## 2020-04-17 ENCOUNTER — Other Ambulatory Visit (HOSPITAL_COMMUNITY)
Admission: RE | Admit: 2020-04-17 | Discharge: 2020-04-17 | Disposition: A | Discharge status: Home / Self Care | Payer: BC Managed Care – PPO | Source: Ambulatory Visit | Attending: Orthopedic Surgery | Admitting: Orthopedic Surgery

## 2020-04-17 LAB — SARS CORONAVIRUS 2 (TAT 6-24 HRS): SARS Coronavirus 2: NEGATIVE

## 2020-04-18 ENCOUNTER — Ambulatory Visit (HOSPITAL_BASED_OUTPATIENT_CLINIC_OR_DEPARTMENT_OTHER): Payer: BC Managed Care – PPO | Admitting: Certified Registered"

## 2020-04-18 ENCOUNTER — Ambulatory Visit (HOSPITAL_BASED_OUTPATIENT_CLINIC_OR_DEPARTMENT_OTHER)
Admission: RE | Admit: 2020-04-18 | Discharge: 2020-04-18 | Disposition: A | Discharge status: Home / Self Care | Payer: BC Managed Care – PPO | Attending: Orthopedic Surgery | Admitting: Orthopedic Surgery

## 2020-04-18 ENCOUNTER — Encounter (HOSPITAL_BASED_OUTPATIENT_CLINIC_OR_DEPARTMENT_OTHER): Payer: Self-pay | Admitting: Orthopedic Surgery

## 2020-04-18 ENCOUNTER — Other Ambulatory Visit: Payer: Self-pay

## 2020-04-18 ENCOUNTER — Encounter (HOSPITAL_BASED_OUTPATIENT_CLINIC_OR_DEPARTMENT_OTHER)
Admission: RE | Disposition: A | Discharge status: Home / Self Care | Payer: Self-pay | Source: Home / Self Care | Attending: Orthopedic Surgery

## 2020-04-18 DIAGNOSIS — Z882 Allergy status to sulfonamides status: Secondary | ICD-10-CM | POA: Diagnosis not present

## 2020-04-18 DIAGNOSIS — Z79899 Other long term (current) drug therapy: Secondary | ICD-10-CM | POA: Insufficient documentation

## 2020-04-18 DIAGNOSIS — Z791 Long term (current) use of non-steroidal anti-inflammatories (NSAID): Secondary | ICD-10-CM | POA: Diagnosis not present

## 2020-04-18 DIAGNOSIS — Z880 Allergy status to penicillin: Secondary | ICD-10-CM | POA: Diagnosis not present

## 2020-04-18 DIAGNOSIS — Z9889 Other specified postprocedural states: Secondary | ICD-10-CM | POA: Insufficient documentation

## 2020-04-18 DIAGNOSIS — G43909 Migraine, unspecified, not intractable, without status migrainosus: Secondary | ICD-10-CM | POA: Diagnosis not present

## 2020-04-18 DIAGNOSIS — M25312 Other instability, left shoulder: Secondary | ICD-10-CM | POA: Insufficient documentation

## 2020-04-18 DIAGNOSIS — S43432D Superior glenoid labrum lesion of left shoulder, subsequent encounter: Secondary | ICD-10-CM | POA: Diagnosis not present

## 2020-04-18 DIAGNOSIS — M8580 Other specified disorders of bone density and structure, unspecified site: Secondary | ICD-10-CM | POA: Insufficient documentation

## 2020-04-18 HISTORY — PX: SHOULDER LATERJET: SHX6528

## 2020-04-18 HISTORY — PX: SHOULDER ARTHROSCOPY WITH ROTATOR CUFF REPAIR AND OPEN BICEPS TENODESIS: SHX6677

## 2020-04-18 SURGERY — SHOULDER ARTHROSCOPY WITH ROTATOR CUFF REPAIR AND OPEN BICEPS TENODESIS
Anesthesia: General | Site: Shoulder | Laterality: Left

## 2020-04-18 MED ORDER — TRANEXAMIC ACID-NACL 1000-0.7 MG/100ML-% IV SOLN
1000.0000 mg | INTRAVENOUS | Status: AC
  Administered 2020-04-18: 1000 mg via INTRAVENOUS

## 2020-04-18 MED ORDER — VANCOMYCIN HCL 1000 MG IV SOLR
INTRAVENOUS | Status: AC
  Filled 2020-04-18: qty 1000

## 2020-04-18 MED ORDER — PROPOFOL 10 MG/ML IV BOLUS
INTRAVENOUS | Status: AC
  Filled 2020-04-18: qty 20

## 2020-04-18 MED ORDER — EPINEPHRINE PF 1 MG/ML IJ SOLN
INTRAMUSCULAR | Status: AC
  Filled 2020-04-18: qty 2

## 2020-04-18 MED ORDER — SUCCINYLCHOLINE CHLORIDE 20 MG/ML IJ SOLN
INTRAMUSCULAR | Status: DC | PRN
  Administered 2020-04-18: 100 mg via INTRAVENOUS

## 2020-04-18 MED ORDER — BUPIVACAINE HCL (PF) 0.5 % IJ SOLN
INTRAMUSCULAR | Status: DC | PRN
Start: 2020-04-18 — End: 2020-04-18
  Administered 2020-04-18: 20 mL via PERINEURAL

## 2020-04-18 MED ORDER — VANCOMYCIN HCL IN DEXTROSE 1-5 GM/200ML-% IV SOLN
1000.0000 mg | INTRAVENOUS | Status: AC
  Administered 2020-04-18: 1000 mg via INTRAVENOUS

## 2020-04-18 MED ORDER — FENTANYL CITRATE (PF) 100 MCG/2ML IJ SOLN
INTRAMUSCULAR | Status: AC
  Filled 2020-04-18: qty 2

## 2020-04-18 MED ORDER — ONDANSETRON HCL 4 MG/2ML IJ SOLN
INTRAMUSCULAR | Status: DC | PRN
  Administered 2020-04-18: 4 mg via INTRAVENOUS

## 2020-04-18 MED ORDER — OXYCODONE HCL 5 MG PO TABS
5.0000 mg | ORAL_TABLET | Freq: Once | ORAL | Status: AC | PRN
  Administered 2020-04-18: 5 mg via ORAL

## 2020-04-18 MED ORDER — METHOCARBAMOL 500 MG PO TABS: 500.0000 mg | ORAL_TABLET | Freq: Three times a day (TID) | ORAL | 0 refills | Status: DC | PRN

## 2020-04-18 MED ORDER — DEXAMETHASONE SODIUM PHOSPHATE 4 MG/ML IJ SOLN
INTRAMUSCULAR | Status: DC | PRN
  Administered 2020-04-18: 5 mg via INTRAVENOUS

## 2020-04-18 MED ORDER — SUGAMMADEX SODIUM 200 MG/2ML IV SOLN
INTRAVENOUS | Status: DC | PRN
  Administered 2020-04-18: 125 mg via INTRAVENOUS

## 2020-04-18 MED ORDER — VANCOMYCIN HCL IN DEXTROSE 1-5 GM/200ML-% IV SOLN
INTRAVENOUS | Status: AC
  Filled 2020-04-18: qty 200

## 2020-04-18 MED ORDER — PROPOFOL 10 MG/ML IV BOLUS
INTRAVENOUS | Status: DC | PRN
  Administered 2020-04-18: 150 mg via INTRAVENOUS
  Administered 2020-04-18: 20 mg via INTRAVENOUS

## 2020-04-18 MED ORDER — POVIDONE-IODINE 10 % EX SWAB
2.0000 "application " | Freq: Once | CUTANEOUS | Status: AC
  Administered 2020-04-18: 2 via TOPICAL

## 2020-04-18 MED ORDER — DEXAMETHASONE SODIUM PHOSPHATE 10 MG/ML IJ SOLN
INTRAMUSCULAR | Status: AC
  Filled 2020-04-18: qty 1

## 2020-04-18 MED ORDER — PROMETHAZINE HCL 25 MG/ML IJ SOLN: 6.2500 mg | INTRAMUSCULAR | Status: DC | PRN

## 2020-04-18 MED ORDER — LIDOCAINE-EPINEPHRINE 1 %-1:100000 IJ SOLN
INTRAMUSCULAR | Status: AC
  Filled 2020-04-18: qty 1

## 2020-04-18 MED ORDER — EPINEPHRINE PF 1 MG/ML IJ SOLN
INTRAMUSCULAR | Status: DC | PRN
  Administered 2020-04-18: 1 mg

## 2020-04-18 MED ORDER — OXYCODONE HCL 5 MG PO TABS
ORAL_TABLET | ORAL | Status: AC
  Filled 2020-04-18: qty 1

## 2020-04-18 MED ORDER — BUPIVACAINE LIPOSOME 1.3 % IJ SUSP
INTRAMUSCULAR | Status: DC | PRN
Start: 2020-04-18 — End: 2020-04-18
  Administered 2020-04-18: 10 mL via PERINEURAL

## 2020-04-18 MED ORDER — LACTATED RINGERS IV SOLN: INTRAVENOUS | Status: DC

## 2020-04-18 MED ORDER — OXYCODONE HCL 5 MG/5ML PO SOLN: 5.0000 mg | Freq: Once | ORAL | Status: AC | PRN

## 2020-04-18 MED ORDER — ONDANSETRON HCL 4 MG/2ML IJ SOLN
INTRAMUSCULAR | Status: AC
  Filled 2020-04-18: qty 2

## 2020-04-18 MED ORDER — OXYCODONE HCL 5 MG PO TABS: 5.0000 mg | ORAL_TABLET | ORAL | 0 refills | Status: DC | PRN

## 2020-04-18 MED ORDER — TRANEXAMIC ACID-NACL 1000-0.7 MG/100ML-% IV SOLN
INTRAVENOUS | Status: AC
  Filled 2020-04-18: qty 100

## 2020-04-18 MED ORDER — MIDAZOLAM HCL 2 MG/2ML IJ SOLN
1.0000 mg | INTRAMUSCULAR | Status: DC | PRN
  Administered 2020-04-18: 2 mg via INTRAVENOUS

## 2020-04-18 MED ORDER — ASPIRIN EC 81 MG PO TBEC
81.0000 mg | DELAYED_RELEASE_TABLET | Freq: Every day | ORAL | 0 refills | Status: AC
Start: 2020-04-18 — End: 2021-04-18

## 2020-04-18 MED ORDER — OXYCODONE-ACETAMINOPHEN 5-325 MG PO TABS: 1.0000 | ORAL_TABLET | ORAL | 0 refills | Status: DC | PRN

## 2020-04-18 MED ORDER — LIDOCAINE 2% (20 MG/ML) 5 ML SYRINGE
INTRAMUSCULAR | Status: DC | PRN
  Administered 2020-04-18: 60 mg via INTRAVENOUS

## 2020-04-18 MED ORDER — LIDOCAINE 2% (20 MG/ML) 5 ML SYRINGE
INTRAMUSCULAR | Status: AC
  Filled 2020-04-18: qty 5

## 2020-04-18 MED ORDER — FENTANYL CITRATE (PF) 100 MCG/2ML IJ SOLN
50.0000 ug | INTRAMUSCULAR | Status: AC | PRN
  Administered 2020-04-18 (×2): 25 ug via INTRAVENOUS
  Administered 2020-04-18: 100 ug via INTRAVENOUS
  Administered 2020-04-18 (×3): 25 ug via INTRAVENOUS

## 2020-04-18 MED ORDER — VANCOMYCIN HCL 1000 MG IV SOLR
INTRAVENOUS | Status: DC | PRN
  Administered 2020-04-18: 1000 mg via INTRAVENOUS

## 2020-04-18 MED ORDER — POVIDONE-IODINE 7.5 % EX SOLN
Freq: Once | CUTANEOUS | Status: DC
  Filled 2020-04-18: qty 118

## 2020-04-18 MED ORDER — VANCOMYCIN HCL 1000 MG IV SOLR
INTRAVENOUS | Status: DC | PRN
  Administered 2020-04-18: 1000 mg via TOPICAL

## 2020-04-18 MED ORDER — ROCURONIUM 10MG/ML (10ML) SYRINGE FOR MEDFUSION PUMP - OPTIME
INTRAVENOUS | Status: DC | PRN
  Administered 2020-04-18: 10 mg via INTRAVENOUS

## 2020-04-18 MED ORDER — HYDROMORPHONE HCL 1 MG/ML IJ SOLN: 0.2500 mg | INTRAMUSCULAR | Status: DC | PRN

## 2020-04-18 MED ORDER — MEPERIDINE HCL 25 MG/ML IJ SOLN: 6.2500 mg | INTRAMUSCULAR | Status: DC | PRN

## 2020-04-18 MED ORDER — SODIUM CHLORIDE 0.9 % IR SOLN
Status: DC | PRN
  Administered 2020-04-18: 3000 mL

## 2020-04-18 MED ORDER — MIDAZOLAM HCL 2 MG/2ML IJ SOLN
INTRAMUSCULAR | Status: AC
  Filled 2020-04-18: qty 2

## 2020-04-18 SURGICAL SUPPLY — 117 items
AID PSTN UNV HD RSTRNT DISP (MISCELLANEOUS) ×1
ANCHOR SUT 1.8 FBRTK KNTLS 2SU (Anchor) ×4 IMPLANT
BIT DRILL 2.75 .066 CANNLTION (DRILL) IMPLANT
BIT DRILL NON CANNULATED 4MM (DRILL) IMPLANT
BLADE AVERAGE 25MMX9MM (BLADE) ×1
BLADE AVERAGE 25X9 (BLADE) ×1 IMPLANT
BLADE EXCALIBUR 4.0MM X 13CM (MISCELLANEOUS)
BLADE EXCALIBUR 4.0X13 (MISCELLANEOUS) IMPLANT
BLADE SAG MICRO R ANGLED (BLADE) ×2 IMPLANT
BLADE SHAVER BONE 5.0MM X 13CM (MISCELLANEOUS)
BLADE SHAVER BONE 5.0X13 (MISCELLANEOUS) IMPLANT
BLADE SURG 10 STRL SS (BLADE) ×2 IMPLANT
BLADE SURG 15 STRL LF DISP TIS (BLADE) IMPLANT
BLADE SURG 15 STRL SS (BLADE) ×9
BURR OVAL 8 FLU 5.0MM X 13CM (MISCELLANEOUS) ×1
BURR OVAL 8 FLU 5.0X13 (MISCELLANEOUS) ×1 IMPLANT
CANNULA 5.75X71 LONG (CANNULA) IMPLANT
CANNULA TWIST IN 8.25X7CM (CANNULA) IMPLANT
CLEANER CAUTERY TIP 5X5 PAD (MISCELLANEOUS) IMPLANT
CLOSURE STERI-STRIP 1/2X4 (GAUZE/BANDAGES/DRESSINGS) ×1
CLSR STERI-STRIP ANTIMIC 1/2X4 (GAUZE/BANDAGES/DRESSINGS) ×1 IMPLANT
COVER SURGICAL LIGHT HANDLE (MISCELLANEOUS) ×2 IMPLANT
COVER WAND RF STERILE (DRAPES) IMPLANT
DECANTER SPIKE VIAL GLASS SM (MISCELLANEOUS) IMPLANT
DISSECTOR  3.8MM X 13CM (MISCELLANEOUS)
DISSECTOR 3.8MM X 13CM (MISCELLANEOUS) IMPLANT
DISSECTOR 4.0MM X 13CM (MISCELLANEOUS) ×3 IMPLANT
DRAPE IMP U-DRAPE 54X76 (DRAPES) ×3 IMPLANT
DRAPE INCISE IOBAN 66X45 STRL (DRAPES) ×3 IMPLANT
DRAPE STERI 35X30 U-POUCH (DRAPES) ×3 IMPLANT
DRAPE U-SHAPE 47X51 STRL (DRAPES) ×8 IMPLANT
DRAPE U-SHAPE 76X120 STRL (DRAPES) ×6 IMPLANT
DRILL 2.75 .066 CANNULATION (DRILL) ×3
DRILL NON CANNULATED 4MM (DRILL) ×3
DRSG AQUACEL AG ADV 3.5X 6 (GAUZE/BANDAGES/DRESSINGS) ×3 IMPLANT
DRSG TEGADERM 4X4.75 (GAUZE/BANDAGES/DRESSINGS) ×4 IMPLANT
DURAPREP 26ML APPLICATOR (WOUND CARE) ×3 IMPLANT
DW OUTFLOW CASSETTE/TUBE SET (MISCELLANEOUS) ×3 IMPLANT
ELECT NDL TIP 2.8 STRL (NEEDLE) IMPLANT
ELECT NEEDLE TIP 2.8 STRL (NEEDLE) ×3 IMPLANT
ELECT REM PT RETURN 9FT ADLT (ELECTROSURGICAL) ×3
ELECTRODE REM PT RTRN 9FT ADLT (ELECTROSURGICAL) ×1 IMPLANT
EXCALIBUR 3.8MM X 13CM (MISCELLANEOUS) IMPLANT
GAUZE SPONGE 4X4 12PLY STRL (GAUZE/BANDAGES/DRESSINGS) ×3 IMPLANT
GAUZE XEROFORM 1X8 LF (GAUZE/BANDAGES/DRESSINGS) ×3 IMPLANT
GLOVE BIO SURGEON STRL SZ7 (GLOVE) ×3 IMPLANT
GLOVE BIOGEL PI IND STRL 6.5 (GLOVE) IMPLANT
GLOVE BIOGEL PI IND STRL 7.0 (GLOVE) ×1 IMPLANT
GLOVE BIOGEL PI IND STRL 8 (GLOVE) ×1 IMPLANT
GLOVE BIOGEL PI INDICATOR 6.5 (GLOVE) ×2
GLOVE BIOGEL PI INDICATOR 7.0 (GLOVE) ×4
GLOVE BIOGEL PI INDICATOR 8 (GLOVE) ×2
GLOVE ECLIPSE 6.5 STRL STRAW (GLOVE) ×4 IMPLANT
GLOVE SURG ORTHO 8.0 STRL STRW (GLOVE) ×3 IMPLANT
GOWN STRL REUS W/ TWL LRG LVL3 (GOWN DISPOSABLE) ×2 IMPLANT
GOWN STRL REUS W/ TWL XL LVL3 (GOWN DISPOSABLE) ×1 IMPLANT
GOWN STRL REUS W/TWL LRG LVL3 (GOWN DISPOSABLE) ×9
GOWN STRL REUS W/TWL XL LVL3 (GOWN DISPOSABLE) ×3
GUIDEWIRE .062X12IN LONG (WIRE) ×4 IMPLANT
GUIDEWIRE .062X6IN LONG (WIRE) ×6 IMPLANT
KIT BIO-TENODESIS 3X8 DISP (MISCELLANEOUS)
KIT INSRT BABSR STRL DISP BTN (MISCELLANEOUS) IMPLANT
KIT STR SPEAR 1.8 FBRTK DISP (KITS) ×2 IMPLANT
LOOP VESSEL MAXI BLUE (MISCELLANEOUS) ×2 IMPLANT
MANIFOLD NEPTUNE II (INSTRUMENTS) ×3 IMPLANT
NDL SAFETY ECLIPSE 18X1.5 (NEEDLE) ×2 IMPLANT
NDL SCORPION MULTI FIRE (NEEDLE) IMPLANT
NDL SPNL 18GX3.5 QUINCKE PK (NEEDLE) IMPLANT
NDL SUT 6 .5 CRC .975X.05 MAYO (NEEDLE) IMPLANT
NEEDLE HYPO 18GX1.5 SHARP (NEEDLE) ×3
NEEDLE MAYO TAPER (NEEDLE)
NEEDLE SCORPION MULTI FIRE (NEEDLE) IMPLANT
NEEDLE SPNL 18GX3.5 QUINCKE PK (NEEDLE) IMPLANT
NS IRRIG 1000ML POUR BTL (IV SOLUTION) ×2 IMPLANT
PACK ARTHROSCOPY DSU (CUSTOM PROCEDURE TRAY) ×3 IMPLANT
PAD CLEANER CAUTERY TIP 5X5 (MISCELLANEOUS)
PENCIL SMOKE EVACUATOR (MISCELLANEOUS) ×3 IMPLANT
PORT APPOLLO RF 90DEGREE MULTI (SURGICAL WAND) ×3 IMPLANT
RESTRAINT HEAD UNIVERSAL NS (MISCELLANEOUS) ×3 IMPLANT
SCREW CANN F/T 3.75X34MM (Screw) ×2 IMPLANT
SCREW CANNULATED 3.75X36MM FT (Screw) ×2 IMPLANT
SET BASIN DAY SURGERY F.S. (CUSTOM PROCEDURE TRAY) ×3 IMPLANT
SHEET MEDIUM DRAPE 40X70 STRL (DRAPES) IMPLANT
SLEEVE SCD COMPRESS KNEE MED (MISCELLANEOUS) ×3 IMPLANT
SLING ARM FOAM STRAP LRG (SOFTGOODS) IMPLANT
SLING ULTRA III MED (ORTHOPEDIC SUPPLIES) ×2 IMPLANT
SPONGE LAP 18X18 RF (DISPOSABLE) ×4 IMPLANT
SPONGE LAP 4X18 RFD (DISPOSABLE) ×4 IMPLANT
SPONGE SURGIFOAM ABS GEL 12-7 (HEMOSTASIS) IMPLANT
STAPLER VISISTAT 35W (STAPLE) IMPLANT
SUCTION FRAZIER HANDLE 10FR (MISCELLANEOUS) ×3
SUCTION TUBE FRAZIER 10FR DISP (MISCELLANEOUS) ×1 IMPLANT
SUT BONE WAX W31G (SUTURE) IMPLANT
SUT ETHIBOND 2 OS 4 DA (SUTURE) IMPLANT
SUT ETHILON 3 0 PS 1 (SUTURE) ×3 IMPLANT
SUT FIBERWIRE #2 38 T-5 BLUE (SUTURE) ×3
SUT FIBERWIRE 2-0 18 17.9 3/8 (SUTURE)
SUT MNCRL AB 3-0 PS2 18 (SUTURE) ×2 IMPLANT
SUT PDS AB 0 CT 36 (SUTURE) IMPLANT
SUT SILK 2 0 TIES 17X18 (SUTURE)
SUT SILK 2-0 18XBRD TIE BLK (SUTURE) IMPLANT
SUT TICRON 1 T 12 (SUTURE) IMPLANT
SUT VIC AB 1 CT1 27 (SUTURE) ×3
SUT VIC AB 1 CT1 27XBRD ANBCTR (SUTURE) IMPLANT
SUT VIC AB 2-0 SH 27 (SUTURE) ×6
SUT VIC AB 2-0 SH 27XBRD (SUTURE) IMPLANT
SUT VICRYL 0 UR6 27IN ABS (SUTURE) ×8 IMPLANT
SUT VICRYL 4-0 PS2 18IN ABS (SUTURE) ×1 IMPLANT
SUTURE FIBERWR #2 38 T-5 BLUE (SUTURE) IMPLANT
SUTURE FIBERWR 2-0 18 17.9 3/8 (SUTURE) IMPLANT
SYR 5ML LL (SYRINGE) IMPLANT
TOWEL GREEN STERILE FF (TOWEL DISPOSABLE) ×3 IMPLANT
TRAY DSU PREP LF (CUSTOM PROCEDURE TRAY) ×1 IMPLANT
TUBE CONNECTING 20'X1/4 (TUBING) ×1
TUBE CONNECTING 20X1/4 (TUBING) ×2 IMPLANT
TUBING ARTHROSCOPY IRRIG 16FT (MISCELLANEOUS) ×3 IMPLANT
YANKAUER SUCT BULB TIP NO VENT (SUCTIONS) ×3 IMPLANT

## 2020-04-18 NOTE — H&P (Signed)
Douglas Dixon is an 24 y.o. male.   Chief Complaint: Left shoulder instability HPI: Douglas Dixon is a 24 year old patient with left shoulder instability.  Had arthroscopic Bankart done several years ago.  Bony Bankart fragment was present.  This was done at another location.  He developed recurrent instability several years after that index procedure.  MRI scanning does show recurrent capsular labral disruption along with bone loss of about 15 to 20% of the anterior inferior glenoid.  Patient presents now for recurrent instability to be stabilized by J procedure as well as biceps release and tenodesis.  He is very active.  We discussed revision arthroscopic capsular surgery but because of his bone loss I think the latter today is his best option based on his high activity level to keep the shoulder stable.  Past Medical History:  Diagnosis Date  . 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 Loose body in joint of shoulder region 04/2016   left  . Migraines   . Shoulder joint dislocation 04/2016   left  . Stuffy and runny nose 05/15/2016   clear drainage from nose, per pt.    Past Surgical History:  Procedure Laterality Date  . CHONDROPLASTY Left 05/22/2016   Procedure: CHONDROPLASTY;  Surgeon: Loreta Ave, MD;  Location: [facility];  Service: Orthopedics;  Laterality: Left;  . INGUINAL HERNIA REPAIR    . SHOULDER ARTHROSCOPY WITH BANKART REPAIR Left 05/22/2016   Procedure: LEFT SHOULDER ARTHROSCOPY WITH DEBRIDEMENT, BANKART REPAIR, EXCISION OF LOOSE BODY, CHONDROPLASTY;  Surgeon: Loreta Ave, MD;  Location: [facility];  Service: Orthopedics;  Laterality: Left;  . THYROGLOSSAL DUCT CYST    . TONSILLECTOMY AND ADENOIDECTOMY  06/11/2001    History reviewed. No pertinent family history. Social History:  reports that he has never smoked. He has never used smokeless tobacco. He reports current alcohol use. He reports that he does not use  drugs.  Allergies:  Allergies  Allergen Reactions  . Penicillins Anaphylaxis  . Sulfa Antibiotics Anaphylaxis    Medications Prior to Admission  Medication Sig Dispense Refill  . Ibuprofen 200 MG CAPS Take by mouth.    . Multiple Vitamin (MULTIVITAMIN) tablet Take 1 tablet by mouth daily.      No results found for this or any previous visit (from the past 48 hour(s)). No results found.  Review of Systems  Musculoskeletal: Positive for arthralgias.  All other systems reviewed and are negative.   Blood pressure 127/69, pulse (!) 53, temperature 98 F (36.7 C), temperature source Oral, resp. rate 15, height 5\' 7"  (1.702 m), weight 61.2 kg, SpO2 99 %. Physical Exam  Constitutional: He appears well-developed.  HENT:  Head: Normocephalic.  Eyes: Pupils are equal, round, and reactive to light.  Cardiovascular: Normal rate.  Respiratory: Effort normal.  Musculoskeletal:     Cervical back: Normal range of motion.  Neurological: He is alert.  Skin: Skin is warm.  Psychiatric: He has a normal mood and affect.  Examination of the left shoulder demonstrates good deltoid strength as well as good infraspinatus supraspinatus and subscap strength.  Does have positive anterior apprehension.  No posterior apprehension.  Less than a centimeter sulcus sign.  Motor sensory function of the hand is intact.  External rotation of 15 degrees of abduction is about 75 degrees bilaterally.  Assessment/Plan Impression is recurrent left shoulder instability following shoulder stabilization procedure years ago.  Patient did have a bony Bankart at that time.  That bony  Bankart has healed in the medial position and the patient has had some significant recurrent instability since that time.  Because this is a revision situation with bone loss the operative plan is for arthroscopy with biceps tendon release followed by biceps tenodesis and J procedure.  The risk and benefits are discussed with the patient include  not limited to infection nerve vessel damage incomplete healing shoulder stiffness as well as potential for recurrent instability.  Patient understands risk benefits and wishes to proceed.  Anderson Malta, MD 04/18/2020, 12:25 PM

## 2020-04-18 NOTE — Brief Op Note (Signed)
   04/18/2020  4:30 PM  PATIENT:  Beatrice Lecher  24 y.o. male  PRE-OPERATIVE DIAGNOSIS:  left shoulder instability  POST-OPERATIVE DIAGNOSIS:  left shoulder instability  PROCEDURE:  Procedure(s): Left Shoulder Arthroscopy, Biceps Release,  Open Tenodesis Left Shoulder Laterjet  SURGEON:  Surgeon(s): August Saucer, Corrie Mckusick, MD  ASSISTANT: magnant pa ANESTHESIA:   general  EBL: 100 ml    Total I/O In: 2000 [I.V.:2000] Out: 60 [Blood:60]  BLOOD ADMINISTERED: none  DRAINS: none   LOCAL MEDICATIONS USED:  none  SPECIMEN:  No Specimen  COUNTS:  YES  TOURNIQUET:  * No tourniquets in log *  DICTATION: .Other Dictation: Dictation Number [id number]  PLAN OF CARE: Discharge to home after PACU  PATIENT DISPOSITION:  PACU - hemodynamically stable

## 2020-04-18 NOTE — Anesthesia Procedure Notes (Signed)
Procedure Name: Intubation Date/Time: 04/18/2020 12:39 PM Performed by: Maryella Shivers, CRNA Pre-anesthesia Checklist: Patient identified, Emergency Drugs available, Suction available and Patient being monitored Patient Re-evaluated:Patient Re-evaluated prior to induction Oxygen Delivery Method: Circle system utilized Preoxygenation: Pre-oxygenation with 100% oxygen Induction Type: IV induction Ventilation: Mask ventilation without difficulty Laryngoscope Size: Mac and 3 Grade View: Grade I Tube type: Oral Tube size: 7.5 mm Number of attempts: 1 Airway Equipment and Method: Stylet and Oral airway Placement Confirmation: ETT inserted through vocal cords under direct vision,  positive ETCO2 and breath sounds checked- equal and bilateral Secured at: 21 cm Tube secured with: Tape Dental Injury: Teeth and Oropharynx as per pre-operative assessment

## 2020-04-18 NOTE — Op Note (Signed)
NAMEEGON, DITTUS MEDICAL RECORD JQ:[id number] ACCOUNT [id number] DATE OF BIRTH:1996/02/23 FACILITY: [facility] LOCATION: [facility] PHYSICIAN:Myosha Cuadras Diamantina Providence, MD  OPERATIVE REPORT  DATE OF PROCEDURE:  04/18/2020  PREOPERATIVE DIAGNOSIS:  Left shoulder instability.  POSTOPERATIVE DIAGNOSIS:  Left shoulder instability.  PROCEDURES:   1.  Left shoulder arthroscopy with superior labral debridement.  2.  Biceps tendon release.  3.  Open biceps tenodesis. 4.  Open Latarjet procedure.  SURGEON:  Cammy Copa, MD  ASSISTANT:  Karenann Cai, PA.  INDICATIONS:  The  patient is a 24 year old patient who had prior surgical stabilization done elsewhere 5 or 6 years ago, has had recurrent instability with some bone loss in the anterior glenoid,  presents now for revision surgery consisting of biceps  tenodesis for anterior superior SLAP tear and open Latarjet procedure.  This was done after explanation of risks and benefits.  PROCEDURE IN DETAIL:  The patient was brought to the operating room where general anesthetic was induced.  Preoperative antibiotics administered.  Timeout was called.  The patient was placed in the beach chair position with the head in neutral position.   Left shoulder was pre-scrubbed with hydrogen peroxide, then alcohol and Betadine, which was allowed to air dry, prepped with ChloraPrep solution and draped in a sterile manner.  Ioban used to cover the entire operative field.  Posterior portal was  created.  The patient was examined under anesthesia first and found to have significant anterior instability, 3+ with a one-half plus posterior instability and about a 1 centimeter sulcus sign.  Next, the posterior portal was created 2 cm medial and  inferior to the posterolateral margin of the acromion.  Diagnostic arthroscopy was performed.  The patient did have evidence of capsule labral detachment from the 6 o'clock to 12 o'clock position.  An anterior portal was created.   Biceps tendon released.   Next, the glenohumeral joint was intact.  There was some bone loss off of the anteroinferior glenoid rim.  Next, the instruments were removed.  Portals were closed.  Incision was then made from just superior to the coracoid process, extending distally.   The deltopectoral interval was developed.  Deltoid mobilized laterally.  Cephalic vein mobilized medially.  The tributary was avulsed and the cephalic vein was ligated.  Next, the Cobell retractor was placed and the biceps tendon was tenodesed in the  bicipital groove under appropriate tension using 2 Arthrex knotless suture tacks.  Next, attention was directed towards the coracoid process.  The axillary nerve was visualized and palpated and was protected at all times during the case.  The coracoid  process was then identified.  The conjoined tendon was attached.  The coracoacromial ligament was released off the lateral side of the coracoid process.  The pectoralis was released on the medial side with protection of the underlying neurovascular  structures.  The musculocutaneous nerve was near in this dissection.  The coracoclavicular ligaments were palpated and visualized.  The coracoid was then transected using an 80-degree oscillating saw from Arthrex.  The coracoid process was then drilled.   The coracoid was then prepared.  On the medial side of the coracoid where the pec was attached, a small sliver of bone was removed in order to flatten out that part of it was going to be compressed against the anteroinferior glenoid.  Next, 2 holes were  drilled.  These were in very good bone.  The soft tissue was dissected in order to immobilize the coracoid enough to go down  to the anteroinferior glenoid rim.  At this time, the subscapularis was split at the junction of the superior two-thirds and the  inferior one-third.  The capsule was then dissected and split longitudinally.  A Fukuda retractor was placed and attention was directed to  the glenoid itself.  Anterior retractor was placed.  The glenoid rim was then prepared using a rongeur.  A nice  flat bony surface was achieved.  The prepared coracoid process was then flipped so that its previous medial side was on the glenoid rim.  In accordance with preoperative templating, 36 and 34 screws were used.  The coracoid process was placed at the  approximately 7 o'clock to 10 o'clock position on the glenoid.  Very good fixation was achieved with the screws.  The Arthrex guide was utilized to make sure that the coracoid bone block was flush with the glenoid.  This gave very good additional  stability.  Next, the thorough irrigation was performed.  Vancomycin powder placed within the joint.  The capsule was then closed laterally to medially up to the point where the coracoid process went through the subscap split.  Capsule was repaired on  the medial side as well.  This definitely improved stability of the shoulder.  Thorough irrigation again performed and the deltopectoral interval was closed using #1 Vicryl suture, followed by interrupted inverted 0 Vicryl suture, 2-0 Vicryl suture and  3-0 Monocryl.  The patient tolerated the procedure well without immediate complication.  Luke's assistance was required at all times during the case for retraction, opening and closing.  His assistance was a medical necessity.  The screw sizes were 34  and 36 mm.  VN/NUANCE  D:04/18/2020 T:04/18/2020 JOB:[id number]

## 2020-04-18 NOTE — Progress Notes (Signed)
Assisted Dr. Miller with left, ultrasound guided, interscalene  block. Side rails up, monitors on throughout procedure. See vital signs in flow sheet. Tolerated Procedure well.  

## 2020-04-18 NOTE — Discharge Instructions (Signed)
Oxycodone given at 5:50pm. Next dose of Oxycodone or Percocet can be given at 10:00pm if needed.    Post Anesthesia Home Care Instructions  Activity: Get plenty of rest for the remainder of the day. A responsible individual must stay with you for 24 hours following the procedure.  For the next 24 hours, DO NOT: -Drive a car -Paediatric nurse -Drink alcoholic beverages -Take any medication unless instructed by your physician -Make any legal decisions or sign important papers.  Meals: Start with liquid foods such as gelatin or soup. Progress to regular foods as tolerated. Avoid greasy, spicy, heavy foods. If nausea and/or vomiting occur, drink only clear liquids until the nausea and/or vomiting subsides. Call your physician if vomiting continues.  Special Instructions/Symptoms: Your throat may feel dry or sore from the anesthesia or the breathing tube placed in your throat during surgery. If this causes discomfort, gargle with warm salt water. The discomfort should disappear within 24 hours.       Regional Anesthesia Blocks  1. Numbness or the inability to move the "blocked" extremity may last from 3-48 hours after placement. The length of time depends on the medication injected and your individual response to the medication. If the numbness is not going away after 48 hours, call your surgeon.  2. The extremity that is blocked will need to be protected until the numbness is gone and the  Strength has returned. Because you cannot feel it, you will need to take extra care to avoid injury. Because it may be weak, you may have difficulty moving it or using it. You may not know what position it is in without looking at it while the block is in effect.  3. For blocks in the legs and feet, returning to weight bearing and walking needs to be done carefully. You will need to wait until the numbness is entirely gone and the strength has returned. You should be able to move your leg and foot  normally before you try and bear weight or walk. You will need someone to be with you when you first try to ensure you do not fall and possibly risk injury.  4. Bruising and tenderness at the needle site are common side effects and will resolve in a few days.  5. Persistent numbness or new problems with movement should be communicated to the surgeon or the [facility] [phone] [place] [phone]).   Information for Discharge Teaching: EXPAREL (bupivacaine liposome injectable suspension)   Your surgeon or anesthesiologist gave you EXPAREL(bupivacaine) to help control your pain after surgery.   EXPAREL is a local anesthetic that provides pain relief by numbing the tissue around the surgical site.  EXPAREL is designed to release pain medication over time and can control pain for up to 72 hours.  Depending on how you respond to EXPAREL, you may require less pain medication during your recovery.  Possible side effects:  Temporary loss of sensation or ability to move in the area where bupivacaine was injected.  Nausea, vomiting, constipation  Rarely, numbness and tingling in your mouth or lips, lightheadedness, or anxiety may occur.  Call your doctor right away if you think you may be experiencing any of these sensations, or if you have other questions regarding possible side effects.  Follow all other discharge instructions given to you by your surgeon or nurse. Eat a healthy diet and drink plenty of water or other fluids.  If you return to the hospital for any reason  within 96 hours following the administration of EXPAREL, it is important for health care providers to know that you have received this anesthetic. A teal colored band has been placed on your arm with the date, time and amount of EXPAREL you have received in order to alert and inform your health care providers. Please leave this armband in place for the full 96 hours following  administration, and then you may remove the band.

## 2020-04-18 NOTE — Anesthesia Postprocedure Evaluation (Signed)
Anesthesia Post Note  Patient: Douglas Dixon  Procedure(s) Performed: Left Shoulder Arthroscopy, Biceps Release,  Open Tenodesis (Left Shoulder) Left Shoulder Laterjet (Left Shoulder)     Patient location during evaluation: PACU Anesthesia Type: General Level of consciousness: awake and alert Pain management: pain level controlled Vital Signs Assessment: post-procedure vital signs reviewed and stable Respiratory status: spontaneous breathing, nonlabored ventilation and respiratory function stable Cardiovascular status: blood pressure returned to baseline and stable Postop Assessment: no apparent nausea or vomiting Anesthetic complications: no    Last Vitals:  Vitals:   04/18/20 1700 04/18/20 1715  BP: (!) 146/79 (!) 144/92  Pulse: (!) 104 96  Resp: 13 13  Temp: 36.9 C   SpO2: 98% 96%    Last Pain:  Vitals:   04/18/20 1037  TempSrc: Oral  PainSc: 0-No pain                 Lowella Curb

## 2020-04-18 NOTE — Anesthesia Procedure Notes (Signed)
Anesthesia Regional Block: Interscalene brachial plexus block   Pre-Anesthetic Checklist: ,, timeout performed, Correct Patient, Correct Site, Correct Laterality, Correct Procedure, Correct Position, site marked, Risks and benefits discussed,  Surgical consent,  Pre-op evaluation,  At surgeon's request and post-op pain management  Laterality: Left  Prep: chloraprep       Needles:  Injection technique: Single-shot  Needle Type: Stimiplex     Needle Length: 9cm  Needle Gauge: 21     Additional Needles:   Procedures:,,,, ultrasound used (permanent image in chart),,,,  Narrative:  Start time: 04/18/2020 11:35 AM End time: 04/18/2020 11:40 AM Injection made incrementally with aspirations every 5 mL.  Performed by: Personally  Anesthesiologist: Lowella Curb, MD

## 2020-04-18 NOTE — Anesthesia Preprocedure Evaluation (Signed)
Anesthesia Evaluation  Patient identified by MRN, date of birth, ID band Patient awake    Reviewed: Allergy & Precautions, NPO status , Patient's Chart, lab work & pertinent test results  Airway Mallampati: II  TM Distance: >3 FB Neck ROM: full    Dental no notable dental hx.    Pulmonary neg pulmonary ROS,    Pulmonary exam normal breath sounds clear to auscultation       Cardiovascular negative cardio ROS Normal cardiovascular exam Rhythm:regular Rate:Normal     Neuro/Psych  Headaches,    GI/Hepatic   Endo/Other    Renal/GU      Musculoskeletal   Abdominal   Peds  Hematology   Anesthesia Other Findings   Reproductive/Obstetrics                             Anesthesia Physical  Anesthesia Plan  ASA: II  Anesthesia Plan: General   Post-op Pain Management:  Regional for Post-op pain   Induction: Intravenous  PONV Risk Score and Plan: 2 and Ondansetron, Midazolam and Treatment may vary due to age or medical condition  Airway Management Planned: Oral ETT and LMA  Additional Equipment:   Intra-op Plan:   Post-operative Plan: Extubation in OR  Informed Consent: I have reviewed the patients History and Physical, chart, labs and discussed the procedure including the risks, benefits and alternatives for the proposed anesthesia with the patient or authorized representative who has indicated his/her understanding and acceptance.       Plan Discussed with: CRNA, Anesthesiologist and Surgeon  Anesthesia Plan Comments:         Anesthesia Quick Evaluation

## 2020-04-18 NOTE — Transfer of Care (Signed)
Immediate Anesthesia Transfer of Care Note  Patient: Douglas Dixon  Procedure(s) Performed: Left Shoulder Arthroscopy, Biceps Release,  Open Tenodesis (Left Shoulder) Left Shoulder Laterjet (Left Shoulder)  Patient Location: PACU  Anesthesia Type:GA combined with regional for post-op pain  Level of Consciousness: awake, alert  and oriented  Airway & Oxygen Therapy: Patient Spontanous Breathing and Patient connected to face mask oxygen  Post-op Assessment: Report given to RN and Post -op Vital signs reviewed and stable  Post vital signs: Reviewed and stable  Last Vitals:  Vitals Value Taken Time  BP 146/79 04/18/20 1700  Temp    Pulse 114 04/18/20 1701  Resp 15 04/18/20 1701  SpO2 97 % 04/18/20 1701  Vitals shown include unvalidated device data.  Last Pain:  Vitals:   04/18/20 1037  TempSrc: Oral  PainSc: 0-No pain      Patients Stated Pain Goal: 3 (04/18/20 1037)  Complications: No apparent anesthesia complications

## 2020-04-19 ENCOUNTER — Encounter: Payer: Self-pay | Admitting: *Deleted

## 2020-04-26 ENCOUNTER — Ambulatory Visit: Payer: Self-pay

## 2020-04-26 ENCOUNTER — Other Ambulatory Visit: Payer: Self-pay

## 2020-04-26 ENCOUNTER — Encounter: Payer: Self-pay | Admitting: Surgical

## 2020-04-26 ENCOUNTER — Ambulatory Visit (INDEPENDENT_AMBULATORY_CARE_PROVIDER_SITE_OTHER): Payer: BC Managed Care – PPO | Admitting: Surgical

## 2020-04-26 DIAGNOSIS — M25312 Other instability, left shoulder: Secondary | ICD-10-CM

## 2020-04-26 MED ORDER — OXYCODONE-ACETAMINOPHEN 5-325 MG PO TABS: 1.0000 | ORAL_TABLET | Freq: Three times a day (TID) | ORAL | 0 refills | Status: AC | PRN

## 2020-04-26 MED ORDER — METHOCARBAMOL 500 MG PO TABS: 500.0000 mg | ORAL_TABLET | Freq: Three times a day (TID) | ORAL | 0 refills | Status: AC | PRN

## 2020-04-26 NOTE — Progress Notes (Signed)
Post-Op Visit Note   Patient: Douglas Dixon           Date of Birth: [birth year]           MRN: [id number] Visit Date: 04/26/2020 PCP: Gareth Morgan, MD   Assessment & Plan:  Chief Complaint: No chief complaint on file.  Visit Diagnoses:  1. Instability of left shoulder joint     Plan: Patient is a 24 year old male presents s/p left shoulder latarjet procedure with open biceps tenodesis on 04/18/2020.  Patient is doing well following surgery and his pain is well controlled.  He has not been lifting anything with the operative arm and has remained in the sling as instructed.  On exam his incisions are healing well.  Sutures were removed and replaced with Steri-Strips.  He has about 70 degrees of abduction and 80 degrees of forward flexion with very limited external rotation.  Axillary nerve is functioning well.  Motor function of the left hand is functioning as well with intact EPL, IP flexion, finger abduction, finger adduction, wrist extension.  Sensation intact to the left hand.  Plan for patient to remain in the sling, only coming out for showers.  He is sleeping in a recliner.  Radiographs taken today reveal coracoid process bone graft in good position with good screw fixation.  Plan for patient to follow-up in 2 weeks for clinical recheck.  He will remain nonweightbearing with that arm and in the sling until we see him back.  Follow-Up Instructions: No follow-ups on file.   Orders:  Orders Placed This Encounter  Procedures  . XR Shoulder Left   No orders of the defined types were placed in this encounter.   Imaging: No results found.  PMFS History: Patient Active Problem List   Diagnosis Date Noted  . 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 Loose body in joint of shoulder region 04/2016   left  . Migraines   . Shoulder joint dislocation 04/2016   left  . Stuffy  and runny nose 05/15/2016   clear drainage from nose, per pt.    No family history on file.  Past Surgical History:  Procedure Laterality Date  . CHONDROPLASTY Left 05/22/2016   Procedure: CHONDROPLASTY;  Surgeon: Loreta Ave, MD;  Location: [facility];  Service: Orthopedics;  Laterality: Left;  . INGUINAL HERNIA REPAIR    . SHOULDER ARTHROSCOPY WITH BANKART REPAIR Left 05/22/2016   Procedure: LEFT SHOULDER ARTHROSCOPY WITH DEBRIDEMENT, BANKART REPAIR, EXCISION OF LOOSE BODY, CHONDROPLASTY;  Surgeon: Loreta Ave, MD;  Location: [facility];  Service: Orthopedics;  Laterality: Left;  . SHOULDER ARTHROSCOPY WITH ROTATOR CUFF REPAIR AND OPEN BICEPS TENODESIS Left 04/18/2020   Procedure: Left Shoulder Arthroscopy, Biceps Release,  Open Tenodesis;  Surgeon: Cammy Copa, MD;  Location: [facility];  Service: Orthopedics;  Laterality: Left;  . SHOULDER LATERJET Left 04/18/2020   Procedure: Left Shoulder Laterjet;  Surgeon: Cammy Copa, MD;  Location:  SUR[facility]ERY CENTER;  Service: Orthopedics;  Laterality: Left;  . THYROGLOSSAL DUCT CYST    . TONSILLECTOMY AND ADENOIDECTOMY  06/11/2001   Social History   Occupational History  . Not on file  Tobacco Use  . Smoking status: Never Smoker  . Smokeless tobacco: Never Used  Substance and Sexual Activity  . Alcohol use: Yes    Comment: occasionally  .  Drug use: No  . Sexual activity: Not on file

## 2020-05-09 ENCOUNTER — Ambulatory Visit (INDEPENDENT_AMBULATORY_CARE_PROVIDER_SITE_OTHER): Payer: BC Managed Care – PPO | Admitting: Orthopedic Surgery

## 2020-05-09 ENCOUNTER — Encounter: Payer: Self-pay | Admitting: Orthopedic Surgery

## 2020-05-09 ENCOUNTER — Other Ambulatory Visit: Payer: Self-pay

## 2020-05-09 VITALS — Ht 67.0 in | Wt 134.0 lb

## 2020-05-09 DIAGNOSIS — M25312 Other instability, left shoulder: Secondary | ICD-10-CM

## 2020-05-11 ENCOUNTER — Encounter: Payer: Self-pay | Admitting: Orthopedic Surgery

## 2020-05-11 NOTE — Progress Notes (Signed)
   Post-Op Visit Note   Patient: Douglas Dixon           Date of Birth: 10/27/1996           MRN: [id number] Visit Date: 05/09/2020 PCP: Douglas Morgan, MD   Assessment & Plan:  Chief Complaint:  Chief Complaint  Patient presents with  . Left Shoulder - Routine Post Op   Visit Diagnoses:  1. Instability of left shoulder joint     Plan: Douglas Dixon is now 3 weeks out left shoulder arthroscopy biceps tenodesis and J procedure. On exam deltoid is functional. Has about 10 degrees of external rotation. I [name] in about 2 weeks and we will start him in some physical therapy at that time. That would be 5 weeks postop. Bone graft feels good on exam. Shoulder feels stable. He is can have some stiffness but I really want that bone graft to heal. When we see him back in 2 weeks we will need to get axillary radiographs and AP radiographs just to check incorporation of that bone graft.  Follow-Up Instructions: Return in about 2 weeks (around 05/23/2020).   Orders:  No orders of the defined types were placed in this encounter.  No orders of the defined types were placed in this encounter.   Imaging: No results found.  PMFS History: Patient Active Problem List   Diagnosis Date Noted  . 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 Loose body in joint of shoulder region 04/2016   left  . Migraines   . Shoulder joint dislocation 04/2016   left  . Stuffy and runny nose 05/15/2016   clear drainage from nose, per pt.    History reviewed. No pertinent family history.  Past Surgical History:  Procedure Laterality Date  . CHONDROPLASTY Left 05/22/2016   Procedure: CHONDROPLASTY;  Surgeon: Loreta Ave, MD;  Location: [facility];  Service: Orthopedics;  Laterality: Left;  . INGUINAL HERNIA REPAIR    . SHOULDER ARTHROSCOPY WITH BANKART REPAIR Left 05/22/2016   Procedure:  LEFT SHOULDER ARTHROSCOPY WITH DEBRIDEMENT, BANKART REPAIR, EXCISION OF LOOSE BODY, CHONDROPLASTY;  Surgeon: Loreta Ave, MD;  Location: [facility];  Service: Orthopedics;  Laterality: Left;  . SHOULDER ARTHROSCOPY WITH ROTATOR CUFF REPAIR AND OPEN BICEPS TENODESIS Left 04/18/2020   Procedure: Left Shoulder Arthroscopy, Biceps Release,  Open Tenodesis;  Surgeon: Cammy Copa, MD;  Location: [facility];  Service: Orthopedics;  Laterality: Left;  . SHOULDER LATERJET Left 04/18/2020   Procedure: Left Shoulder Laterjet;  Surgeon: Cammy Copa, MD;  Location: [facility];  Service: Orthopedics;  Laterality: Left;  . THYROGLOSSAL DUCT CYST    . TONSILLECTOMY AND ADENOIDECTOMY  06/11/2001   Social History   Occupational History  . Not on file  Tobacco Use  . Smoking status: Never Smoker  . Smokeless tobacco: Never Used  Substance and Sexual Activity  . Alcohol use: Yes    Comment: occasionally  . Drug use: No  . Sexual activity: Not on file

## 2020-05-23 ENCOUNTER — Ambulatory Visit (INDEPENDENT_AMBULATORY_CARE_PROVIDER_SITE_OTHER): Payer: BC Managed Care – PPO

## 2020-05-23 ENCOUNTER — Ambulatory Visit (INDEPENDENT_AMBULATORY_CARE_PROVIDER_SITE_OTHER): Payer: BC Managed Care – PPO | Admitting: Orthopedic Surgery

## 2020-05-23 ENCOUNTER — Other Ambulatory Visit: Payer: Self-pay

## 2020-05-23 DIAGNOSIS — M25512 Pain in left shoulder: Secondary | ICD-10-CM

## 2020-05-24 ENCOUNTER — Encounter: Payer: Self-pay | Admitting: Orthopedic Surgery

## 2020-05-24 NOTE — Progress Notes (Signed)
   Post-Op Visit Note   Patient: Douglas Dixon           Date of Birth: 06-14-1996           MRN: [id number] Visit Date: 05/23/2020 PCP: Gareth Morgan, MD   Assessment & Plan:  Chief Complaint:  Chief Complaint  Patient presents with  . Left Shoulder - Follow-up   Visit Diagnoses:  1. Left shoulder pain, unspecified chronicity     Plan: Douglas Dixon is now between 5 and 6 weeks out left shoulder arthroscopy with biceps tenodesis and J.  He has been in a sling.  On exam subscap strength is reasonable but he only has about 10 degrees of external rotation.  Forward flexion abduction improving.  Plan is to discontinue the sling.  No lifting with left arm.  Okay to start physical therapy here for range of motion and strengthening 2 times a week for 6 weeks.  No external rotation more than 45 degrees.  6-week return for clinical recheck.  Radiographs today look good in terms of screw length and bone placement.  Follow-Up Instructions: No follow-ups on file.   Orders:  Orders Placed This Encounter  Procedures  . XR Shoulder Left  . Ambulatory referral to Physical Therapy   No orders of the defined types were placed in this encounter.   Imaging: No results found.  PMFS History: Patient Active Problem List   Diagnosis Date Noted  . 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 Loose body in joint of shoulder region 04/2016   left  . Migraines   . Shoulder joint dislocation 04/2016   left  . Stuffy and runny nose 05/15/2016   clear drainage from nose, per pt.    No family history on file.  Past Surgical History:  Procedure Laterality Date  . CHONDROPLASTY Left 05/22/2016   Procedure: CHONDROPLASTY;  Surgeon: Loreta Ave, MD;  Location: [facility];  Service: Orthopedics;  Laterality: Left;  . INGUINAL HERNIA REPAIR    . SHOULDER ARTHROSCOPY WITH BANKART REPAIR  Left 05/22/2016   Procedure: LEFT SHOULDER ARTHROSCOPY WITH DEBRIDEMENT, BANKART REPAIR, EXCISION OF LOOSE BODY, CHONDROPLASTY;  Surgeon: Loreta Ave, MD;  Location: [facility];  Service: Orthopedics;  Laterality: Left;  . SHOULDER ARTHROSCOPY WITH ROTATOR CUFF REPAIR AND OPEN BICEPS TENODESIS Left 04/18/2020   Procedure: Left Shoulder Arthroscopy, Biceps Release,  Open Tenodesis;  Surgeon: Cammy Copa, MD;  Location: [facility];  Service: Orthopedics;  Laterality: Left;  . SHOULDER LATERJET Left 04/18/2020   Procedure: Left Shoulder Laterjet;  Surgeon: Cammy Copa, MD;  Location: [facility];  Service: Orthopedics;  Laterality: Left;  . THYROGLOSSAL DUCT CYST    . TONSILLECTOMY AND ADENOIDECTOMY  06/11/2001   Social History   Occupational History  . Not on file  Tobacco Use  . Smoking status: Never Smoker  . Smokeless tobacco: Never Used  Substance and Sexual Activity  . Alcohol use: Yes    Comment: occasionally  . Drug use: No  . Sexual activity: Not on file

## 2020-06-04 ENCOUNTER — Ambulatory Visit (INDEPENDENT_AMBULATORY_CARE_PROVIDER_SITE_OTHER): Payer: BC Managed Care – PPO | Admitting: Rehabilitative and Restorative Service Providers"

## 2020-06-04 ENCOUNTER — Encounter: Payer: Self-pay | Admitting: Rehabilitative and Restorative Service Providers"

## 2020-06-04 ENCOUNTER — Other Ambulatory Visit: Payer: Self-pay

## 2020-06-04 DIAGNOSIS — M25612 Stiffness of left shoulder, not elsewhere classified: Secondary | ICD-10-CM

## 2020-06-04 DIAGNOSIS — G8929 Other chronic pain: Secondary | ICD-10-CM | POA: Diagnosis not present

## 2020-06-04 DIAGNOSIS — M6281 Muscle weakness (generalized): Secondary | ICD-10-CM

## 2020-06-04 DIAGNOSIS — M25512 Pain in left shoulder: Secondary | ICD-10-CM

## 2020-06-04 NOTE — Therapy (Signed)
[facility] Physical Therapy [address] [place], [place], [address] Phone: [phone]   Fax:  [phone]  Physical Therapy Evaluation  Patient Details  Name: Douglas Dixon MRN: [id number] Date of Birth: [birth year] Referring Provider (PT): Dr. August Saucer   Encounter Date: 06/04/2020   PT End of Session - 06/04/20 1136    Visit Number 1    Number of Visits 12    Date for PT Re-Evaluation 07/16/20    Authorization Type 30 visit limit per year    Authorization Time Period POC 06/04/2020 - 07/16/2020    PT Start Time 1145    PT Stop Time 1221    PT Time Calculation (min) 36 min    Activity Tolerance Patient tolerated treatment well    Behavior During Therapy [facility] for tasks assessed/performed           Past Medical History:  Diagnosis Date  . 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 Loose body in joint of shoulder region 04/2016   left  . Migraines   . Shoulder joint dislocation 04/2016   left  . Stuffy and runny nose 05/15/2016   clear drainage from nose, per pt.    Past Surgical History:  Procedure Laterality Date  . CHONDROPLASTY Left 05/22/2016   Procedure: CHONDROPLASTY;  Surgeon: Loreta Ave, MD;  Location: [facility];  Service: Orthopedics;  Laterality: Left;  . INGUINAL HERNIA REPAIR    . SHOULDER ARTHROSCOPY WITH BANKART REPAIR Left 05/22/2016   Procedure: LEFT SHOULDER ARTHROSCOPY WITH DEBRIDEMENT, BANKART REPAIR, EXCISION OF LOOSE BODY, CHONDROPLASTY;  Surgeon: Loreta Ave, MD;  Location: [facility];  Service: Orthopedics;  Laterality: Left;  . SHOULDER ARTHROSCOPY WITH ROTATOR CUFF REPAIR AND OPEN BICEPS TENODESIS Left 04/18/2020   Procedure: Left Shoulder Arthroscopy, Biceps Release,  Open Tenodesis;  Surgeon: Cammy Copa, MD;  Location: [facility];  Service: Orthopedics;  Laterality: Left;  . SHOULDER LATERJET Left 04/18/2020   Procedure: Left Shoulder Laterjet;   Surgeon: Cammy Copa, MD;  Location:  [facility];  Service: Orthopedics;  Laterality: Left;  . THYROGLOSSAL DUCT CYST    . TONSILLECTOMY AND ADENOIDECTOMY  06/11/2001    There were no vitals filed for this visit.    Subjective Assessment - 06/04/20 1143    Subjective Pt. is s/p recent Lt shoulder bicep release performed 04/18/2020.  Pt. stated multiple dislocations to Lt shoulder in last year leading up to surgery.  Pt. indicated previous history of sugery to Lt shoulder prior to current surgery recovery.  Pt. stated feeling fairly well most of the time but does have soreness and some pain at rest c occasional sharp pain at times.  Has been out of sling since last visit.    Patient Stated Goals Return to work, usual recreational activity.    Currently in Pain? Yes    Pain Score 0-No pain   at worst 7/10   Pain Location Shoulder    Pain Orientation Left    Pain Descriptors / Indicators Tightness;Sore;Sharp   Anterior shoulder/incision, lateral upper arm.   Pain Onset More than a month ago    Aggravating Factors  End range movement in multiple directions, lifting/carrying, pushing off elbow.    Pain Relieving Factors Medicine at times    Effect of Pain on Daily Activities Limited in work as Curator (reaching, sustained movements, lifting)              [facility] PT  Assessment - 06/04/20 0001      Assessment   Medical Diagnosis S/P Lt shoulder biceps release    Referring Provider (PT) Dr. August Saucer    Onset Date/Surgical Date 04/18/20    Hand Dominance Right      Precautions   Precaution Comments ER less than or equal to 45 deg, biceps loading      Restrictions   Weight Bearing Restrictions No      Balance Screen   Has the patient fallen in the past 6 months Yes    How many times? multiple in year    Has the patient had a decrease in activity level because of a fear of falling?  Yes   2/2 due to pain   Is the patient reluctant to leave their home because of a fear  of falling?  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)   Leisure Paediatric nurse, snow boarding, bike riding      Cognition   Overall Cognitive Status Within Functional Limits for tasks assessed      Observation/Other Assessments   Observations Incision healing      Posture/Postural Control   Posture Comments mild scapular protraction, anterior tilt on Lt greater than Rt      ROM / Strength   AROM / PROM / Strength Strength;PROM;AROM      AROM   Overall AROM Comments Tightness at end range in all directions    AROM Assessment Site Shoulder    Right/Left Shoulder Left;Right    Right Shoulder Internal Rotation 65 Degrees   measured in supine 90 deg abd   Right Shoulder External Rotation 100 Degrees   measured in supine 90 deg abd   Left Shoulder Flexion 110 Degrees    Left Shoulder ABduction 90 Degrees    Left Shoulder Internal Rotation 60 Degrees   measured in supine 45 deg abd   Left Shoulder External Rotation 20 Degrees   measured in supine 45 deg abd     PROM   Overall PROM Comments Tightness at end range all directions.     PROM Assessment Site Shoulder    Right/Left Shoulder Left;Right    Left Shoulder Flexion 115 Degrees    Left Shoulder ABduction 95 Degrees    Left Shoulder Internal Rotation 65 Degrees   measured in supine 45 deg abd   Left Shoulder External Rotation 25 Degrees   measured in supine 45 deg abd     Strength   Overall Strength Comments --    Strength Assessment Site Forearm;Elbow;Shoulder    Right/Left Shoulder Left;Right    Right Shoulder Flexion 5/5    Right Shoulder Extension 5/5    Right Shoulder ABduction 5/5    Right Shoulder Internal Rotation 5/5    Right Shoulder External Rotation 5/5    Left Shoulder Flexion 2+/5    Left Shoulder ABduction 2+/5    Right/Left Elbow Left;Right    Right Elbow Flexion 5/5    Right Elbow Extension 5/5    Left Elbow Extension  5/5    Right/Left Forearm Left;Right      Palpation   Palpation comment Mild tenderness anterior incision                      Objective measurements completed on examination: See above findings.       [facility] Adult PT Treatment/Exercise -  06/04/20 0001      Exercises   Exercises Shoulder      Shoulder Exercises: Supine   Other Supine Exercises wand flexion, abd 2 x 10    Other Supine Exercises supine active flexion 2 x 10      Shoulder Exercises: Standing   Other Standing Exercises green band GH ext, rows 20x each      Manual Therapy   Manual therapy comments g2-g3 inferior, ap Lt jt mobs, prom                  PT Education - 06/04/20 1227    Education Details HEP, POC    Person(s) Educated Patient    Methods Explanation;Demonstration;Handout;Verbal cues    Comprehension Verbalized understanding;Returned demonstration               PT Long Term Goals - 06/04/20 1137      PT LONG TERM GOAL #1   Title Patient will demonstrate/report pain at worst less than or equal to 2/10 to facilitate minimal limitation in daily activity secondary to pain symptoms.    Time 6    Period Weeks    Status New    Target Date 07/16/20      PT LONG TERM GOAL #2   Title Patient will demonstrate independent use of home exercise program to facilitate ability to maintain/progress functional gains from skilled physical therapy services.    Time 6    Period Weeks    Status New    Target Date 07/16/20      PT LONG TERM GOAL #3   Title Patient will demonstrate Lt GH joint mobility WFL to facilitate usual self care, dressing, reaching overhead at PLOF s limitation due to symptoms.    Time 6    Period Weeks    Status New    Target Date 07/16/20      PT LONG TERM GOAL #4   Title Patient will demonstrate Lt UE MMT 5/5 throughout to facilitate usual lifting, carrying in functional activity to PLOF s limitation.    Time 6    Period Weeks    Status New    Target Date  07/16/20      PT LONG TERM GOAL #5   Title Pt. will demonstrate/report ability to return work at [employer].    Time 6    Period Weeks    Status New    Target Date 07/16/20                  Plan - 06/04/20 1138    Clinical Impression Statement Patient is a 24 y.o. yo who comes to clinic with complaints of Lt shoulder pain s/p recent biceps release surgery 04/18/2020 with mobility, strength and movement coordination deficits that impair his ability to perform usual daily and recreational functional activities without increase difficulty/symptoms at this time.  Patient to benefit from skilled PT services to address impairments and limitations to improve to previous level of function without restriction secondary to condition.    Personal Factors and Comorbidities Other   History of shoulder dislocation   Examination-Activity Limitations Bathing;Sleep;Carry;Lift;Reach Overhead    Examination-Participation Restrictions Cleaning;Community Activity;Driving    Stability/Clinical Decision Making Stable/Uncomplicated    Clinical Decision Making Low    Rehab Potential Good    PT Frequency 2x / week    PT Duration 6 weeks    PT Treatment/Interventions ADLs/Self Care Home Management;Electrical Stimulation;Iontophoresis 4mg /ml Dexamethasone;Moist Heat;Traction;Balance training;Therapeutic exercise;Therapeutic activities;Functional mobility training;Ultrasound;Neuromuscular re-education;Manual techniques;Vasopneumatic Device;Taping;Dry  needling;Passive range of motion;Spinal Manipulations;Joint Manipulations    PT Next Visit Plan Progress mobiity, AAROM/AROM, possible BFR    PT Home Exercise Plan [email or web address]    Consulted and Agree with Plan of Care Patient           Patient will benefit from skilled therapeutic intervention in order to improve the following deficits and impairments:  Decreased endurance, Pain, Impaired UE functional use, Decreased strength, Decreased activity tolerance, Increased  edema, Decreased mobility, Increased muscle spasms, Impaired perceived functional ability, Decreased coordination  Visit Diagnosis: Muscle weakness (generalized) - Plan: PT plan of care cert/re-cert  Chronic left shoulder pain - Plan: PT plan of care cert/re-cert  Stiffness of left shoulder, not elsewhere classified - Plan: PT plan of care cert/re-cert     Problem List Patient Active Problem List   Diagnosis Date Noted  . 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 [place], [place], [address] Phone: [phone]   Fax:  [phone]  Name: Douglas Dixon MRN: [id number] Date of Birth: 1996/01/14

## 2020-06-04 NOTE — Patient Instructions (Signed)
Access Code: [email or web address] URL: [email or web address] Date: 06/04/2020 Prepared by: Chyrel Masson  Exercises Supine Shoulder Flexion Extension AAROM with Dowel - 2 x daily - 7 x weekly - 10 reps - 3 sets - 5 hold Supine Shoulder Abduction AAROM with Dowel - 2 x daily - 7 x weekly - 3 sets - 10 reps Supine Shoulder Flexion Extension Full Range AROM - 2 x daily - 7 x weekly - 3 sets - 10 reps Tband rows green 3 x 10 tband gh ext green 3 x 10

## 2020-06-08 ENCOUNTER — Other Ambulatory Visit: Payer: Self-pay

## 2020-06-08 ENCOUNTER — Ambulatory Visit (INDEPENDENT_AMBULATORY_CARE_PROVIDER_SITE_OTHER): Payer: BC Managed Care – PPO | Admitting: Rehabilitative and Restorative Service Providers"

## 2020-06-08 ENCOUNTER — Encounter: Payer: Self-pay | Admitting: Rehabilitative and Restorative Service Providers"

## 2020-06-08 DIAGNOSIS — M25612 Stiffness of left shoulder, not elsewhere classified: Secondary | ICD-10-CM | POA: Diagnosis not present

## 2020-06-08 DIAGNOSIS — M6281 Muscle weakness (generalized): Secondary | ICD-10-CM

## 2020-06-08 DIAGNOSIS — G8929 Other chronic pain: Secondary | ICD-10-CM | POA: Diagnosis not present

## 2020-06-08 DIAGNOSIS — M25512 Pain in left shoulder: Secondary | ICD-10-CM

## 2020-06-08 NOTE — Therapy (Signed)
[facility] Physical Therapy [address] [place], [place], [address] Phone: [phone]   Fax:  [phone]  Physical Therapy Treatment  Patient Details  Name: Douglas Dixon MRN: [id number] Date of Birth: 05-Mar-1996 Referring Provider (PT): Dr. August Saucer   Encounter Date: 06/08/2020   PT End of Session - 06/08/20 0958    Visit Number 2    Number of Visits 12    Date for PT Re-Evaluation 07/16/20    Authorization Type 30 visit limit per year    Authorization Time Period POC 06/04/2020 - 07/16/2020    PT Start Time 0930    PT Stop Time 1010    PT Time Calculation (min) 40 min    Activity Tolerance Patient tolerated treatment well    Behavior During Therapy [facility] for tasks assessed/performed           Past Medical History:  Diagnosis Date  . 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 Loose body in joint of shoulder region 04/2016   left  . Migraines   . Shoulder joint dislocation 04/2016   left  . Stuffy and runny nose 05/15/2016   clear drainage from nose, per pt.    Past Surgical History:  Procedure Laterality Date  . CHONDROPLASTY Left 05/22/2016   Procedure: CHONDROPLASTY;  Surgeon: Loreta Ave, MD;  Location: [facility];  Service: Orthopedics;  Laterality: Left;  . INGUINAL HERNIA REPAIR    . SHOULDER ARTHROSCOPY WITH BANKART REPAIR Left 05/22/2016   Procedure: LEFT SHOULDER ARTHROSCOPY WITH DEBRIDEMENT, BANKART REPAIR, EXCISION OF LOOSE BODY, CHONDROPLASTY;  Surgeon: Loreta Ave, MD;  Location: [facility];  Service: Orthopedics;  Laterality: Left;  . SHOULDER ARTHROSCOPY WITH ROTATOR CUFF REPAIR AND OPEN BICEPS TENODESIS Left 04/18/2020   Procedure: Left Shoulder Arthroscopy, Biceps Release,  Open Tenodesis;  Surgeon: Cammy Copa, MD;  Location: [facility];  Service: Orthopedics;  Laterality: Left;  . SHOULDER LATERJET Left 04/18/2020   Procedure: Left Shoulder Laterjet;   Surgeon: Cammy Copa, MD;  Location:  [facility];  Service: Orthopedics;  Laterality: Left;  . THYROGLOSSAL DUCT CYST    . TONSILLECTOMY AND ADENOIDECTOMY  06/11/2001    There were no vitals filed for this visit.   Subjective Assessment - 06/08/20 0957    Subjective Pt. indicated no specific complaints of pain increase, just some soreness overall.    Patient Stated Goals Return to work, usual recreational activity.    Currently in Pain? No/denies    Pain Score 0-No pain    Pain Location Shoulder    Pain Orientation Left    Pain Onset More than a month ago    Aggravating Factors  tightness at end range                             [facility] Adult PT Treatment/Exercise - 06/08/20 0001      Exercises   Exercises Other Exercises    Other Exercises  Lt UE BFR OCP 115, cuff size 2      Shoulder Exercises: Supine   Flexion Left   c BFR 58 mmHG 30 x, 15, 15, 15 c 30 second rest breaks   Other Supine Exercises wand flexion 3 x 10 1 lb c BFR 58 mmHG      Shoulder Exercises: Standing   Extension Both   3 x 15 c BFR 58 mmHg   Theraband Level (  Shoulder Extension) Level 3 (Green)    Row Both;Other (comment)   3x15 c BFR 58 mmHG   Theraband Level (Shoulder Row) Level 3 (Green)      Manual Therapy   Manual therapy comments g2-g3 inferior, ap Lt jt mobs, prom   c BFR 58 mmhg                      PT Long Term Goals - 06/04/20 1137      PT LONG TERM GOAL #1   Title Patient will demonstrate/report pain at worst less than or equal to 2/10 to facilitate minimal limitation in daily activity secondary to pain symptoms.    Time 6    Period Weeks    Status New    Target Date 07/16/20      PT LONG TERM GOAL #2   Title Patient will demonstrate independent use of home exercise program to facilitate ability to maintain/progress functional gains from skilled physical therapy services.    Time 6    Period Weeks    Status New    Target Date 07/16/20       PT LONG TERM GOAL #3   Title Patient will demonstrate Lt Cecil joint mobility WFL to facilitate usual self care, dressing, reaching overhead at PLOF s limitation due to symptoms.    Time 6    Period Weeks    Status New    Target Date 07/16/20      PT LONG TERM GOAL #4   Title Patient will demonstrate Lt UE MMT 5/5 throughout to facilitate usual lifting, carrying in functional activity to PLOF s limitation.    Time 6    Period Weeks    Status New    Target Date 07/16/20      PT LONG TERM GOAL #5   Title Pt. will demonstrate/report ability to return work at [employer].    Time 6    Period Weeks    Status New    Target Date 07/16/20                 Plan - 06/08/20 0957    Clinical Impression Statement Good performance of HEP replication today.  BFR application without complaints at this time, proper response.  Continued active mobility/strengthening indicated to progress towards goals.    Personal Factors and Comorbidities Other   History of shoulder dislocation   Examination-Activity Limitations Bathing;Sleep;Carry;Lift;Reach Overhead    Examination-Participation Restrictions Cleaning;Community Activity;Driving    Stability/Clinical Decision Making Stable/Uncomplicated    Rehab Potential Good    PT Frequency 2x / week    PT Duration 6 weeks    PT Treatment/Interventions ADLs/Self Care Home Management;Electrical Stimulation;Iontophoresis 4mg /ml Dexamethasone;Moist Heat;Traction;Balance training;Therapeutic exercise;Therapeutic activities;Functional mobility training;Ultrasound;Neuromuscular re-education;Manual techniques;Vasopneumatic Device;Taping;Dry needling;Passive range of motion;Spinal Manipulations;Joint Manipulations    PT Next Visit Plan BFR continued, gravity reduced strengthening.    PT Home Exercise Plan [email or web address]    Consulted and Agree with Plan of Care Patient           Patient will benefit from skilled therapeutic intervention in order to improve the following  deficits and impairments:  Decreased endurance, Pain, Impaired UE functional use, Decreased strength, Decreased activity tolerance, Increased edema, Decreased mobility, Increased muscle spasms, Impaired perceived functional ability, Decreased coordination  Visit Diagnosis: Chronic left shoulder pain  Muscle weakness (generalized)  Stiffness of left shoulder, not elsewhere classified     Problem List Patient Active Problem List   Diagnosis Date Noted  .  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

## 2020-06-11 ENCOUNTER — Ambulatory Visit (INDEPENDENT_AMBULATORY_CARE_PROVIDER_SITE_OTHER): Payer: BC Managed Care – PPO | Admitting: Rehabilitative and Restorative Service Providers"

## 2020-06-11 ENCOUNTER — Encounter: Payer: Self-pay | Admitting: Rehabilitative and Restorative Service Providers"

## 2020-06-11 ENCOUNTER — Other Ambulatory Visit: Payer: Self-pay

## 2020-06-11 DIAGNOSIS — M6281 Muscle weakness (generalized): Secondary | ICD-10-CM | POA: Diagnosis not present

## 2020-06-11 DIAGNOSIS — M25612 Stiffness of left shoulder, not elsewhere classified: Secondary | ICD-10-CM | POA: Diagnosis not present

## 2020-06-11 DIAGNOSIS — M25512 Pain in left shoulder: Secondary | ICD-10-CM | POA: Diagnosis not present

## 2020-06-11 DIAGNOSIS — G8929 Other chronic pain: Secondary | ICD-10-CM

## 2020-06-11 NOTE — Therapy (Addendum)
[facility] Therapy [address] [place], [place], [address] Phone: [phone]   Fax:  [phone]  Physical Therapy Treatment  Patient Details  Name: Douglas Dixon MRN: [id number] Date of Birth: 1996-07-02 Referring Provider (PT): Dr. Marlou Sa   Encounter Date: 06/11/2020   PT End of Session - 06/11/20 1259    Visit Number 3    Number of Visits 12    Date for PT Re-Evaluation 07/16/20    Authorization Type 30 visit limit per year    Authorization Time Period POC 06/04/2020 - 07/16/2020    PT Start Time 1259    PT Stop Time 1340    PT Time Calculation (min) 41 min    Activity Tolerance Patient tolerated treatment well    Behavior During Therapy [facility] for tasks assessed/performed           Past Medical History:  Diagnosis Date  . 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 Loose body in joint of shoulder region 04/2016   left  . Migraines   . Shoulder joint dislocation 04/2016   left  . Stuffy and runny nose 05/15/2016   clear drainage from nose, per pt.    Past Surgical History:  Procedure Laterality Date  . CHONDROPLASTY Left 05/22/2016   Procedure: CHONDROPLASTY;  Surgeon: Ninetta Lights, MD;  Location: [place];  Service: Orthopedics;  Laterality: Left;  . INGUINAL HERNIA REPAIR    . SHOULDER ARTHROSCOPY WITH BANKART REPAIR Left 05/22/2016   Procedure: LEFT SHOULDER ARTHROSCOPY WITH DEBRIDEMENT, BANKART REPAIR, EXCISION OF LOOSE BODY, CHONDROPLASTY;  Surgeon: Ninetta Lights, MD;  Location: [place];  Service: Orthopedics;  Laterality: Left;  . SHOULDER ARTHROSCOPY WITH ROTATOR CUFF REPAIR AND OPEN BICEPS TENODESIS Left 04/18/2020   Procedure: Left Shoulder Arthroscopy, Biceps Release,  Open Tenodesis;  Surgeon: Meredith Pel, MD;  Location: [place];  Service: Orthopedics;  Laterality: Left;  . SHOULDER LATERJET Left 04/18/2020   Procedure: Left Shoulder Laterjet;   Surgeon: Meredith Pel, MD;  Location: [place];  Service: Orthopedics;  Laterality: Left;  . THYROGLOSSAL DUCT CYST    . TONSILLECTOMY AND ADENOIDECTOMY  06/11/2001    There were no vitals filed for this visit.                    06/11/20 1251  Symptoms/Limitations  Subjective No pain indicated at rest  Pain Assessment  Currently in Pain? No/denies        [facility] Adult PT Treatment/Exercise - 06/11/20 0001      Neuro Re-ed    Neuro Re-ed Details  Rhythmic Stabilizations in 100 deg flexion, ER/IR in 45 deg abd   BFR 65 mmHg during     Exercises   Other Exercises  Lt UE BFR OCP 115, cuff size 2      Shoulder Exercises: Supine   Flexion Left;Other (comment)   c BFR 58 mmHG 30 x, 15, 15, 15 c 30 second rest breaks     Shoulder Exercises: Sidelying   External Rotation Left;Other (comment)   c BFR 58 mmHG 30 x, 15, 15, 15 c 30 second rest breaks   External Rotation Weight (lbs) 1    ABduction Left;Other (comment)   c BFR 58 mmHG 30 x, 15, 15, 15 c 30 second rest breaks     Shoulder Exercises: ROM/Strengthening   UBE (Upper Arm Bike) lvl 1.5 4 mins fwd/back each way  BFR 60 mmHg     Manual Therapy   Manual therapy comments g2-g3 inferior, ap Lt jt mobs, prom   BFR 65 mmHG                      PT Long Term Goals - 06/04/20 1137      PT LONG TERM GOAL #1   Title Patient will demonstrate/report pain at worst less than or equal to 2/10 to facilitate minimal limitation in daily activity secondary to pain symptoms.    Time 6    Period Weeks    Status New    Target Date 07/16/20      PT LONG TERM GOAL #2   Title Patient will demonstrate independent use of home exercise program to facilitate ability to maintain/progress functional gains from skilled physical therapy services.    Time 6    Period Weeks    Status New    Target Date 07/16/20      PT LONG TERM GOAL #3   Title Patient will demonstrate Lt GH joint mobility WFL to  facilitate usual self care, dressing, reaching overhead at PLOF s limitation due to symptoms.    Time 6    Period Weeks    Status New    Target Date 07/16/20      PT LONG TERM GOAL #4   Title Patient will demonstrate Lt UE MMT 5/5 throughout to facilitate usual lifting, carrying in functional activity to PLOF s limitation.    Time 6    Period Weeks    Status New    Target Date 07/16/20      PT LONG TERM GOAL #5   Title Pt. will demonstrate/report ability to return work at [employer].    Time 6    Period Weeks    Status New    Target Date 07/16/20                 Plan - 06/11/20 1335    Clinical Impression Statement ER limited vs. WFL but to be expected based off surgery type.  Responded well to BFR application and increase use during intervention in clinic today.    Personal Factors and Comorbidities Other   History of shoulder dislocation   Examination-Activity Limitations Bathing;Sleep;Carry;Lift;Reach Overhead    Examination-Participation Restrictions Cleaning;Community Activity;Driving    Stability/Clinical Decision Making Stable/Uncomplicated    Rehab Potential Good    PT Frequency 2x / week    PT Duration 6 weeks    PT Treatment/Interventions ADLs/Self Care Home Management;Electrical Stimulation;Iontophoresis 4mg /ml Dexamethasone;Moist Heat;Traction;Balance training;Therapeutic exercise;Therapeutic activities;Functional mobility training;Ultrasound;Neuromuscular re-education;Manual techniques;Vasopneumatic Device;Taping;Dry needling;Passive range of motion;Spinal Manipulations;Joint Manipulations    PT Next Visit Plan BFR c supine/sidelying AROM/strengthening continued    PT Home Exercise Plan [email or web address]    Consulted and Agree with Plan of Care Patient           Patient will benefit from skilled therapeutic intervention in order to improve the following deficits and impairments:  Decreased endurance, Pain, Impaired UE functional use, Decreased strength, Decreased activity  tolerance, Increased edema, Decreased mobility, Increased muscle spasms, Impaired perceived functional ability, Decreased coordination  Visit Diagnosis: Chronic left shoulder pain  Muscle weakness (generalized)  Stiffness of left shoulder, not elsewhere classified     Problem List Patient Active Problem List   Diagnosis Date Noted  . 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

## 2020-06-15 ENCOUNTER — Other Ambulatory Visit: Payer: Self-pay

## 2020-06-15 ENCOUNTER — Ambulatory Visit: Payer: BC Managed Care – PPO | Admitting: Rehabilitative and Restorative Service Providers"

## 2020-06-15 ENCOUNTER — Encounter: Payer: Self-pay | Admitting: Rehabilitative and Restorative Service Providers"

## 2020-06-15 DIAGNOSIS — M6281 Muscle weakness (generalized): Secondary | ICD-10-CM

## 2020-06-15 DIAGNOSIS — M25612 Stiffness of left shoulder, not elsewhere classified: Secondary | ICD-10-CM | POA: Diagnosis not present

## 2020-06-15 DIAGNOSIS — G8929 Other chronic pain: Secondary | ICD-10-CM

## 2020-06-15 DIAGNOSIS — M25512 Pain in left shoulder: Secondary | ICD-10-CM | POA: Diagnosis not present

## 2020-06-15 NOTE — Therapy (Signed)
[facility] Physical Therapy [address] [place], [place], [address] Phone: [phone]   Fax:  [phone]  Physical Therapy Treatment  Patient Details  Name: Douglas Dixon MRN: [id number] Date of Birth: 11-30-1996 Referring Provider (PT): Dr. August Saucer   Encounter Date: 06/15/2020   PT End of Session - 06/15/20 1434    Visit Number 4    Number of Visits 12    Date for PT Re-Evaluation 07/16/20    Authorization Type 30 visit limit per year    Authorization Time Period POC 06/04/2020 - 07/16/2020    PT Start Time 1348    PT Stop Time 1428    PT Time Calculation (min) 40 min    Activity Tolerance Patient tolerated treatment well    Behavior During Therapy [facility] for tasks assessed/performed           Past Medical History:  Diagnosis Date  . 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 Loose body in joint of shoulder region 04/2016   left  . Migraines   . Shoulder joint dislocation 04/2016   left  . Stuffy and runny nose 05/15/2016   clear drainage from nose, per pt.    Past Surgical History:  Procedure Laterality Date  . CHONDROPLASTY Left 05/22/2016   Procedure: CHONDROPLASTY;  Surgeon: Loreta Ave, MD;  Location: [facility];  Service: Orthopedics;  Laterality: Left;  . INGUINAL HERNIA REPAIR    . SHOULDER ARTHROSCOPY WITH BANKART REPAIR Left 05/22/2016   Procedure: LEFT SHOULDER ARTHROSCOPY WITH DEBRIDEMENT, BANKART REPAIR, EXCISION OF LOOSE BODY, CHONDROPLASTY;  Surgeon: Loreta Ave, MD;  Location: [facility];  Service: Orthopedics;  Laterality: Left;  . SHOULDER ARTHROSCOPY WITH ROTATOR CUFF REPAIR AND OPEN BICEPS TENODESIS Left 04/18/2020   Procedure: Left Shoulder Arthroscopy, Biceps Release,  Open Tenodesis;  Surgeon: Cammy Copa, MD;  Location: [facility];  Service: Orthopedics;  Laterality: Left;  . SHOULDER LATERJET Left 04/18/2020   Procedure: Left Shoulder Laterjet;   Surgeon: Cammy Copa, MD;  Location: [facility];  Service: Orthopedics;  Laterality: Left;  . THYROGLOSSAL DUCT CYST    . TONSILLECTOMY AND ADENOIDECTOMY  06/11/2001    There were no vitals filed for this visit.   Subjective Assessment - 06/15/20 1404    Subjective 04/18/20 surgery (2nd) for recurrent anterior dislocations.    Patient Stated Goals Return to work, usual recreational activity.    Pain Onset More than a month ago                             [facility] Adult PT Treatment/Exercise - 06/15/20 0001      Shoulder Exercises: Supine   Protraction Strengthening;20 reps   3 seconds   Flexion AROM;10 reps   10 seconds Palm in with protraction first     Shoulder Exercises: Sidelying   External Rotation Strengthening;Left;10 reps   2 sets at 1#   External Rotation Weight (lbs) 1      Shoulder Exercises: Standing   Retraction Strengthening;10 reps;Other (comment)   5 seconds     Shoulder Exercises: Isometric Strengthening   External Rotation Other (comment)   10X 5 seconds in a door frame   Internal Rotation --   10X 5 seconds in a door frame (25% effort)                 PT Education - 06/15/20 1433  Education Details Reviewed and updated HEP.    Person(s) Educated Patient    Methods Explanation;Demonstration;Verbal cues;Handout    Comprehension Returned demonstration;Need further instruction;Verbal cues required;Verbalized understanding               PT Long Term Goals - 06/04/20 1137      PT LONG TERM GOAL #1   Title Patient will demonstrate/report pain at worst less than or equal to 2/10 to facilitate minimal limitation in daily activity secondary to pain symptoms.    Time 6    Period Weeks    Status New    Target Date 07/16/20      PT LONG TERM GOAL #2   Title Patient will demonstrate independent use of home exercise program to facilitate ability to maintain/progress functional gains from skilled physical therapy  services.    Time 6    Period Weeks    Status New    Target Date 07/16/20      PT LONG TERM GOAL #3   Title Patient will demonstrate Lt Whitney joint mobility WFL to facilitate usual self care, dressing, reaching overhead at PLOF s limitation due to symptoms.    Time 6    Period Weeks    Status New    Target Date 07/16/20      PT LONG TERM GOAL #4   Title Patient will demonstrate Lt UE MMT 5/5 throughout to facilitate usual lifting, carrying in functional activity to PLOF s limitation.    Time 6    Period Weeks    Status New    Target Date 07/16/20      PT LONG TERM GOAL #5   Title Pt. will demonstrate/report ability to return work at [employer].    Time 6    Period Weeks    Status New    Target Date 07/16/20                 Plan - 06/15/20 1435    Clinical Impression Statement Lateef is moving well post-2nd labral repair.  ER AROM is not being aggressively persued at this time given his history of recurrent dislocations and his post-surgical status.  Flexion, IR and horizontal adduction AROM are progressing nicely.  Scapular and rotator cuff strength will be appropriately progressed to improve stability and not disrupt post-surgical healing.    Personal Factors and Comorbidities Other   History of shoulder dislocation   Examination-Activity Limitations Bathing;Sleep;Carry;Lift;Reach Overhead    Examination-Participation Restrictions Cleaning;Community Activity;Driving    Stability/Clinical Decision Making Stable/Uncomplicated    Rehab Potential Good    PT Frequency 2x / week    PT Duration 6 weeks    PT Treatment/Interventions ADLs/Self Care Home Management;Electrical Stimulation;Iontophoresis 4mg /ml Dexamethasone;Moist Heat;Traction;Balance training;Therapeutic exercise;Therapeutic activities;Functional mobility training;Ultrasound;Neuromuscular re-education;Manual techniques;Vasopneumatic Device;Taping;Dry needling;Passive range of motion;Spinal Manipulations;Joint Manipulations     PT Next Visit Plan BFR c supine/sidelying AROM/strengthening continued    PT [place].  See patient instructions 06/15/20.    Consulted and Agree with Plan of Care Patient           Patient will benefit from skilled therapeutic intervention in order to improve the following deficits and impairments:  Decreased endurance, Pain, Impaired UE functional use, Decreased strength, Decreased activity tolerance, Increased edema, Decreased mobility, Increased muscle spasms, Impaired perceived functional ability, Decreased coordination  Visit Diagnosis: Chronic left shoulder pain  Stiffness of left shoulder, not elsewhere classified  Muscle weakness (generalized)     Problem List Patient Active Problem List  Diagnosis Date Noted  . 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

## 2020-06-15 NOTE — Patient Instructions (Signed)
Access Code: [email or web address] URL: [email or web address] Date: 06/15/2020 Prepared by: Pauletta Browns  Exercises Supine Shoulder Flexion AAROM with Hands Clasped - 2 x daily - 7 x weekly - 1 sets - 10 reps - 10 seconds hold Sidelying Shoulder External Rotation - 2 x daily - 7 x weekly - 2 sets - 10 reps - 3 hold Standing Scapular Retraction - 5 x daily - 7 x weekly - 1 sets - 5 reps - 5 hold Supine Single Arm Shoulder Protraction - 2 x daily - 7 x weekly - 1 sets - 20 reps - 3 seconds hold

## 2020-06-21 ENCOUNTER — Other Ambulatory Visit: Payer: Self-pay

## 2020-06-21 ENCOUNTER — Ambulatory Visit: Payer: BC Managed Care – PPO | Admitting: Rehabilitative and Restorative Service Providers"

## 2020-06-21 ENCOUNTER — Encounter: Payer: Self-pay | Admitting: Rehabilitative and Restorative Service Providers"

## 2020-06-21 DIAGNOSIS — M6281 Muscle weakness (generalized): Secondary | ICD-10-CM

## 2020-06-21 DIAGNOSIS — M25512 Pain in left shoulder: Secondary | ICD-10-CM

## 2020-06-21 DIAGNOSIS — G8929 Other chronic pain: Secondary | ICD-10-CM | POA: Diagnosis not present

## 2020-06-21 DIAGNOSIS — M25612 Stiffness of left shoulder, not elsewhere classified: Secondary | ICD-10-CM

## 2020-06-21 NOTE — Therapy (Signed)
[facility] Physical Therapy [address] [place], [place], [address] Phone: [phone]   Fax:  [phone]  Physical Therapy Treatment  Patient Details  Name: Douglas Dixon MRN: [id number] Date of Birth: March 01, 1996 Referring Provider (PT): Dr. Marlou Sa   Encounter Date: 06/21/2020   PT End of Session - 06/21/20 1252    Visit Number 5    Number of Visits 12    Date for PT Re-Evaluation 07/16/20    Authorization Type 30 visit limit per year    Authorization Time Period POC 06/04/2020 - 07/16/2020    PT Start Time 1300    PT Stop Time 1341    PT Time Calculation (min) 41 min    Activity Tolerance Patient tolerated treatment well    Behavior During Therapy [facility] for tasks assessed/performed           Past Medical History:  Diagnosis Date  . 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 Loose body in joint of shoulder region 04/2016   left  . Migraines   . Shoulder joint dislocation 04/2016   left  . Stuffy and runny nose 05/15/2016   clear drainage from nose, per pt.    Past Surgical History:  Procedure Laterality Date  . CHONDROPLASTY Left 05/22/2016   Procedure: CHONDROPLASTY;  Surgeon: Ninetta Lights, MD;  Location: [place];  Service: Orthopedics;  Laterality: Left;  . INGUINAL HERNIA REPAIR    . SHOULDER ARTHROSCOPY WITH BANKART REPAIR Left 05/22/2016   Procedure: LEFT SHOULDER ARTHROSCOPY WITH DEBRIDEMENT, BANKART REPAIR, EXCISION OF LOOSE BODY, CHONDROPLASTY;  Surgeon: Ninetta Lights, MD;  Location: [place];  Service: Orthopedics;  Laterality: Left;  . SHOULDER ARTHROSCOPY WITH ROTATOR CUFF REPAIR AND OPEN BICEPS TENODESIS Left 04/18/2020   Procedure: Left Shoulder Arthroscopy, Biceps Release,  Open Tenodesis;  Surgeon: Meredith Pel, MD;  Location: [place];  Service: Orthopedics;  Laterality: Left;  . SHOULDER LATERJET Left 04/18/2020   Procedure: Left Shoulder Laterjet;   Surgeon: Meredith Pel, MD;  Location: [place];  Service: Orthopedics;  Laterality: Left;  . THYROGLOSSAL DUCT CYST    . TONSILLECTOMY AND ADENOIDECTOMY  06/11/2001    There were no vitals filed for this visit.   Subjective Assessment - 06/21/20 1302    Subjective Pt. indicated no pain at the moment at rest.  Soreness was present c exercises and some tightness in some movement.    Patient Stated Goals Return to work, usual recreational activity.    Currently in Pain? No/denies    Pain Score 0-No pain    Pain Orientation Left    Pain Onset More than a month ago    Aggravating Factors  tightness at end range                             [facility] Adult PT Treatment/Exercise - 06/21/20 0001      Exercises   Other Exercises  Lt UE BFR LOP 115, cuff size 2      Shoulder Exercises: Standing   External Rotation Left;Other (comment)   BFR 3 x 10    Theraband Level (Shoulder External Rotation) Level 3 (Green)    Extension Both;Other (comment)   3 x 10 BFR 60 mmHg   Theraband Level (Shoulder Extension) Level 4 (Blue)    Row Both;Other (comment)   3 x 10 BFR 60 mmHg   Theraband Level (  Shoulder Row) Level 4 (Blue)    Other Standing Exercises wall push up c SA press x20      Shoulder Exercises: ROM/Strengthening   UBE (Upper Arm Bike) Lvl 2 3 mins fwd/back each c BFR 60 mmHg      Manual Therapy   Manual therapy comments contract/relax MET Lt GH jt ER, active compression/pin and stretch subscap and lat Lt for ROm                       PT Long Term Goals - 06/04/20 1137      PT LONG TERM GOAL #1   Title Patient will demonstrate/report pain at worst less than or equal to 2/10 to facilitate minimal limitation in daily activity secondary to pain symptoms.    Time 6    Period Weeks    Status New    Target Date 07/16/20      PT LONG TERM GOAL #2   Title Patient will demonstrate independent use of home exercise program to facilitate  ability to maintain/progress functional gains from skilled physical therapy services.    Time 6    Period Weeks    Status New    Target Date 07/16/20      PT LONG TERM GOAL #3   Title Patient will demonstrate [name] joint mobility WFL to facilitate usual self care, dressing, reaching overhead at PLOF s limitation due to symptoms.    Time 6    Period Weeks    Status New    Target Date 07/16/20      PT LONG TERM GOAL #4   Title Patient will demonstrate Lt UE MMT 5/5 throughout to facilitate usual lifting, carrying in functional activity to PLOF s limitation.    Time 6    Period Weeks    Status New    Target Date 07/16/20      PT LONG TERM GOAL #5   Title Pt. will demonstrate/report ability to return work at [employer].    Time 6    Period Weeks    Status New    Target Date 07/16/20                 Plan - 06/21/20 1253    Clinical Impression Statement Discussion regarding muscle soreness revealed Pt. as comfortable c BFR use and progression but did enjoy having a little longer between visits due to schedule which allowed some extra rest time.  Elevation attempts against gravity performed today c good overall control c lower resistance.    Personal Factors and Comorbidities Other   History of shoulder dislocation   Examination-Activity Limitations Bathing;Sleep;Carry;Lift;Reach Overhead    Examination-Participation Restrictions Cleaning;Community Activity;Driving    Stability/Clinical Decision Making Stable/Uncomplicated    Rehab Potential Good    PT Frequency 2x / week    PT Duration 6 weeks    PT Treatment/Interventions ADLs/Self Care Home Management;Electrical Stimulation;Iontophoresis 36m/ml Dexamethasone;Moist Heat;Traction;Balance training;Therapeutic exercise;Therapeutic activities;Functional mobility training;Ultrasound;Neuromuscular re-education;Manual techniques;Vasopneumatic Device;Taping;Dry needling;Passive range of motion;Spinal Manipulations;Joint Manipulations    PT  Next Visit Plan Recommend continued use of BFR for lower resistance training, continue to improve periscapular musculature control.    PT Home Exercise Plan [email or web address]    Consulted and Agree with Plan of Care Patient           Patient will benefit from skilled therapeutic intervention in order to improve the following deficits and impairments:  Decreased endurance, Pain, Impaired UE functional use, Decreased strength, Decreased activity tolerance,  Increased edema, Decreased mobility, Increased muscle spasms, Impaired perceived functional ability, Decreased coordination  Visit Diagnosis: Chronic left shoulder pain  Stiffness of left shoulder, not elsewhere classified  Muscle weakness (generalized)     Problem List Patient Active Problem List   Diagnosis Date Noted  . 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 [place], [place], [address] Phone: [phone]   Fax:  [phone]  Name: Douglas Dixon MRN: [id number] Date of Birth: 03/08/1996

## 2020-06-26 ENCOUNTER — Ambulatory Visit: Payer: BC Managed Care – PPO | Admitting: Rehabilitative and Restorative Service Providers"

## 2020-06-26 ENCOUNTER — Other Ambulatory Visit: Payer: Self-pay

## 2020-06-26 ENCOUNTER — Encounter: Payer: Self-pay | Admitting: Rehabilitative and Restorative Service Providers"

## 2020-06-26 DIAGNOSIS — M25512 Pain in left shoulder: Secondary | ICD-10-CM | POA: Diagnosis not present

## 2020-06-26 DIAGNOSIS — M25612 Stiffness of left shoulder, not elsewhere classified: Secondary | ICD-10-CM

## 2020-06-26 DIAGNOSIS — M6281 Muscle weakness (generalized): Secondary | ICD-10-CM

## 2020-06-26 DIAGNOSIS — G8929 Other chronic pain: Secondary | ICD-10-CM | POA: Diagnosis not present

## 2020-06-26 NOTE — Therapy (Signed)
[facility] Physical Therapy [address] [place], [place], [address] Phone: [phone]   Fax:  [phone]  Physical Therapy Treatment  Patient Details  Name: Douglas Dixon MRN: [id number] Date of Birth: [birth year] Referring Provider (PT): Dr. August Saucer   Encounter Date: 06/26/2020   PT End of Session - 06/26/20 1255    Visit Number 6    Number of Visits 12    Date for PT Re-Evaluation 07/16/20    Authorization Type 30 visit limit per year    Authorization Time Period POC 06/04/2020 - 07/16/2020    PT Start Time 1257    PT Stop Time 1340    PT Time Calculation (min) 43 min    Activity Tolerance Patient tolerated treatment well    Behavior During Therapy [facility] for tasks assessed/performed           Past Medical History:  Diagnosis Date  . 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 Loose body in joint of shoulder region 04/2016   left  . Migraines   . Shoulder joint dislocation 04/2016   left  . Stuffy and runny nose 05/15/2016   clear drainage from nose, per pt.    Past Surgical History:  Procedure Laterality Date  . CHONDROPLASTY Left 05/22/2016   Procedure: CHONDROPLASTY;  Surgeon: Loreta Ave, MD;  Location: [facility];  Service: Orthopedics;  Laterality: Left;  . INGUINAL HERNIA REPAIR    . SHOULDER ARTHROSCOPY WITH BANKART REPAIR Left 05/22/2016   Procedure: LEFT SHOULDER ARTHROSCOPY WITH DEBRIDEMENT, BANKART REPAIR, EXCISION OF LOOSE BODY, CHONDROPLASTY;  Surgeon: Loreta Ave, MD;  Location: [facility];  Service: Orthopedics;  Laterality: Left;  . SHOULDER ARTHROSCOPY WITH ROTATOR CUFF REPAIR AND OPEN BICEPS TENODESIS Left 04/18/2020   Procedure: Left Shoulder Arthroscopy, Biceps Release,  Open Tenodesis;  Surgeon: Cammy Copa, MD;  Location: [facility];  Service: Orthopedics;  Laterality: Left;  . SHOULDER LATERJET Left 04/18/2020   Procedure: Left Shoulder Laterjet;   Surgeon: Cammy Copa, MD;  [facility];  Service: Orthopedics;  Laterality: Left;  . THYROGLOSSAL DUCT CYST    . TONSILLECTOMY AND ADENOIDECTOMY  06/11/2001    There were no vitals filed for this visit.   Subjective Assessment - 06/26/20 1259    Subjective Pt. indicated feeling a pop and hearing noise with some rotation activity, both with and without resistance.  Did not occur during last visit but has occurred starting that night and onward. Described it as a"swish"    Patient Stated Goals Return to work, usual recreational activity.    Currently in Pain? 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 August Saucer    Onset Date/Surgical Date 04/18/20    Hand Dominance Right  AROM   Left Shoulder Internal Rotation 65 Degrees   measured in supine 45 deg abd   Left Shoulder External Rotation 40 Degrees   measured in supine 45 deg abd     PROM   Left Shoulder Internal Rotation 68 Degrees    Left Shoulder External Rotation 45 Degrees                         [facility] Adult PT Treatment/Exercise - 06/26/20 0001      Exercises   Other Exercises  Lt UE BFR LOP 115, cuff size 2      Shoulder Exercises: Supine   Other Supine Exercises supine d2 extension green 3 x 10      Shoulder Exercises: Prone   Other Prone Exercises prone y BFR 60 mmHG 30 x, 15, 15, 15    Other Prone Exercises prone t BFR 60 mmHG 30, 15, 15, 15      Shoulder Exercises:  Standing   External Rotation Left;20 reps    Theraband Level (Shoulder External Rotation) Level 3 (Green)    Other Standing Exercises supine 90 deg flexion cw, ccw ball circles 30 x 2 each       Shoulder Exercises: ROM/Strengthening   UBE (Upper Arm Bike) Lvl 3.5 3 mins fwd/back      Manual Therapy   Manual therapy comments g3 ap mobs Lt Gh Jt c PROM                       PT Long Term Goals - 06/26/20 1254      PT LONG TERM GOAL #1   Title Patient will demonstrate/report pain at worst less than or equal to 2/10 to facilitate minimal limitation in daily activity secondary to pain symptoms.    Time 6    Period Weeks    Status On-going    Target Date 07/16/20      PT LONG TERM GOAL #2   Title Patient will demonstrate independent use of home exercise program to facilitate ability to maintain/progress functional gains from skilled physical therapy services.    Time 6    Period Weeks    Status On-going    Target Date 07/16/20      PT LONG TERM GOAL #3   Title Patient will demonstrate Lt GH joint mobility WFL to facilitate usual self care, dressing, reaching overhead at PLOF s limitation due to symptoms.    Time 6    Period Weeks    Status On-going    Target Date 07/16/20      PT LONG TERM GOAL #4   Title Patient will demonstrate Lt UE MMT 5/5 throughout to facilitate usual lifting, carrying in functional activity to PLOF s limitation.    Time 6    Period Weeks    Status On-going    Target Date 07/16/20      PT LONG TERM GOAL #5   Title Pt. will demonstrate/report ability to return work at [employer].    Time 6    Period Weeks    Status On-going    Target Date 07/16/20                 Plan - 06/26/20 1315    Clinical Impression Statement Palpation evaluation and movement pattern assessment did not reveal any notable deformity in tissue at this time.  Tenderness in coracoid process.  Indication of "swish" complaint only a few occurences c  IR mobility so  withheld for HEP at this time.  Overall stability of shoulder still intact at this time.  Continued skilled PT services indicated to improve scapulothoracic control and gravity based strengthening.    Personal Factors and Comorbidities Other   History of shoulder dislocation   Examination-Activity Limitations Bathing;Sleep;Carry;Lift;Reach Overhead    Examination-Participation Restrictions Cleaning;Community Activity;Driving    Stability/Clinical Decision Making Stable/Uncomplicated    Rehab Potential Good    PT Frequency 2x / week    PT Duration 6 weeks    PT Treatment/Interventions ADLs/Self Care Home Management;Electrical Stimulation;Iontophoresis 4mg /ml Dexamethasone;Moist Heat;Traction;Balance training;Therapeutic exercise;Therapeutic activities;Functional mobility training;Ultrasound;Neuromuscular re-education;Manual techniques;Vasopneumatic Device;Taping;Dry needling;Passive range of motion;Spinal Manipulations;Joint Manipulations    PT Next Visit Plan BFR, scapular muscle strengthening (lower trap, middle trap, SA). Progress note for MD next visit.    PT Home Exercise Plan [email or web address]    Consulted and Agree with Plan of Care Patient           Patient will benefit from skilled therapeutic intervention in order to improve the following deficits and impairments:  Decreased endurance, Pain, Impaired UE functional use, Decreased strength, Decreased activity tolerance, Increased edema, Decreased mobility, Increased muscle spasms, Impaired perceived functional ability, Decreased coordination  Visit Diagnosis: Chronic left shoulder pain  Stiffness of left shoulder, not elsewhere classified  Muscle weakness (generalized)     Problem List Patient Active Problem List   Diagnosis Date Noted  . 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

## 2020-06-28 ENCOUNTER — Other Ambulatory Visit: Payer: Self-pay

## 2020-06-28 ENCOUNTER — Ambulatory Visit: Payer: BC Managed Care – PPO | Admitting: Rehabilitative and Restorative Service Providers"

## 2020-06-28 ENCOUNTER — Encounter: Payer: Self-pay | Admitting: Rehabilitative and Restorative Service Providers"

## 2020-06-28 DIAGNOSIS — G8929 Other chronic pain: Secondary | ICD-10-CM

## 2020-06-28 DIAGNOSIS — M25612 Stiffness of left shoulder, not elsewhere classified: Secondary | ICD-10-CM | POA: Diagnosis not present

## 2020-06-28 DIAGNOSIS — M6281 Muscle weakness (generalized): Secondary | ICD-10-CM | POA: Diagnosis not present

## 2020-06-28 DIAGNOSIS — M25512 Pain in left shoulder: Secondary | ICD-10-CM | POA: Diagnosis not present

## 2020-06-28 NOTE — Addendum Note (Signed)
Addended by: Chyrel Masson B on: 06/28/2020 01:35 PM   Modules accepted: Orders

## 2020-06-28 NOTE — Therapy (Signed)
[facility] Physical Therapy [address] [place], [place], [address] Phone: [phone]   Fax:  [phone]  Physical Therapy Treatment/Progress Note/Recertfication  Patient Details  Name: Douglas Dixon MRN: [id number] Date of Birth: 01-13-1996 Referring Provider (PT): Dr. August Saucer   Encounter Date: 06/28/2020 Progress Note Reporting Period 06/04/2020 to 06/28/2020  See note below for Objective Data and Assessment of Progress/Goals.        PT End of Session - 06/28/20 1246    Visit Number 7    Number of Visits 18    Date for PT Re-Evaluation 08/09/20    Authorization Type 30 visit limit per year    Authorization Time Period 06/28/2020 - 08/09/2020    Progress Note Due on Visit 17    PT Start Time 1254    PT Stop Time 1335    PT Time Calculation (min) 41 min    Activity Tolerance Patient tolerated treatment well    Behavior During Therapy [facility] for tasks assessed/performed           Past Medical History:  Diagnosis Date  . 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 Loose body in joint of shoulder region 04/2016   left  . Migraines   . Shoulder joint dislocation 04/2016   left  . Stuffy and runny nose 05/15/2016   clear drainage from nose, per pt.    Past Surgical History:  Procedure Laterality Date  . CHONDROPLASTY Left 05/22/2016   Procedure: CHONDROPLASTY;  Surgeon: Loreta Ave, MD;  Location: [facility];  Service: Orthopedics;  Laterality: Left;  . INGUINAL HERNIA REPAIR    . SHOULDER ARTHROSCOPY WITH BANKART REPAIR Left 05/22/2016   Procedure: LEFT SHOULDER ARTHROSCOPY WITH DEBRIDEMENT, BANKART REPAIR, EXCISION OF LOOSE BODY, CHONDROPLASTY;  Surgeon: Loreta Ave, MD;  Location: [facility];  Service: Orthopedics;  Laterality: Left;  . SHOULDER ARTHROSCOPY WITH ROTATOR CUFF REPAIR AND OPEN BICEPS TENODESIS Left 04/18/2020   Procedure: Left Shoulder Arthroscopy, Biceps Release,  Open Tenodesis;   Surgeon: Cammy Copa, MD;  Location: [facility];  Service: Orthopedics;  Laterality: Left;  . SHOULDER LATERJET Left 04/18/2020   Procedure: Left Shoulder Laterjet;  Surgeon: Cammy Copa, MD;  Location: [facility];  Service: Orthopedics;  Laterality: Left;  . THYROGLOSSAL DUCT CYST    . TONSILLECTOMY AND ADENOIDECTOMY  06/11/2001    There were no vitals filed for this visit.   Subjective Assessment - 06/28/20 1302    Subjective Pt. indicated less popping noted overall since last visit.  Pt. stated pain at worst 4/10.    Patient Stated Goals Return to work, usual recreational activity.    Currently in Pain? No/denies    Pain Score 0-No pain   pain at worst 4/10   Pain Location Shoulder    Pain Orientation Left    Pain Descriptors / Indicators Tightness;Aching    Pain Onset More than a month ago    Pain Frequency Occasional    Aggravating Factors  Tightness at end range, particularily er    Pain Relieving Factors resting, movement for light stretching    Effect of Pain on Daily Activities Work lifting/reaching              [facility] PT Assessment - 06/28/20 0001      Assessment   Medical Diagnosis S/P Lt shoulder biceps release    Referring Provider (PT) Dr. August Saucer    Onset Date/Surgical Date 04/18/20  Hand Dominance Right      AROM   Left Shoulder Flexion 150 Degrees    Left Shoulder ABduction 142 Degrees    Left Shoulder Internal Rotation 75 Degrees    Left Shoulder External Rotation 35 Degrees      PROM   Left Shoulder Flexion 150 Degrees    Left Shoulder ABduction 145 Degrees    Left Shoulder Internal Rotation 75 Degrees    Left Shoulder External Rotation 40 Degrees      Strength   Left Shoulder Flexion 4+/5    Left Shoulder ABduction 4/5    Left Shoulder Internal Rotation 5/5    Left Shoulder External Rotation 4+/5                         [facility] Adult PT Treatment/Exercise - 06/28/20 0001      Exercises     Other Exercises  Lt UE BFR LOP 115, cuff size 2      Shoulder Exercises: Seated   Other Seated Exercises high rows green 3 x 10      Shoulder Exercises: Prone   Other Prone Exercises quadruped y, t c BFR 65 mmHg 3 x 10 each bilateral      Shoulder Exercises: Standing   External Rotation Left;Strengthening   3 x 10 c BFR 60 Lt UE   Theraband Level (Shoulder External Rotation) Level 3 (Green)    Other Standing Exercises SA foam roller flexion at wall 2 x 10, standing lateral green band pulls 3 points x 15 each side    Other Standing Exercises ball circles 90 deg flexion 30 x 2 cw, ccw      Manual Therapy   Manual therapy comments G3 ap mobs Lt GH jt, gentle ER stretching c contract/relax                       PT Long Term Goals - 06/28/20 1247      PT LONG TERM GOAL #1   Title Patient will demonstrate/report pain at worst less than or equal to 2/10 to facilitate minimal limitation in daily activity secondary to pain symptoms.    Time 6    Period Weeks    Status On-going    Target Date 08/09/20      PT LONG TERM GOAL #2   Title Patient will demonstrate independent use of home exercise program to facilitate ability to maintain/progress functional gains from skilled physical therapy services.    Time 6    Period Weeks    Status Achieved    Target Date 08/09/20      PT LONG TERM GOAL #3   Title Patient will demonstrate Lt GH joint mobility WFL to facilitate usual self care, dressing, reaching overhead at PLOF s limitation due to symptoms.    Time 6    Period Weeks    Status On-going    Target Date 08/09/20      PT LONG TERM GOAL #4   Title Patient will demonstrate Lt UE MMT 5/5 throughout to facilitate usual lifting, carrying in functional activity to PLOF s limitation.    Time 6    Period Weeks    Status On-going    Target Date 08/09/20      PT LONG TERM GOAL #5   Title Pt. will demonstrate/report ability to return work at [employer].    Time 6    Period Weeks     Status On-going  Target Date 08/09/20                 Plan - 06/28/20 1304    Clinical Impression Statement Pt. has attended 7 visits overall during course of treatment.  Pt. has reported pain at worst 4/10 occasional at this time.  See objective data for updated information.  Overall progression in mobility and strength noted at this time.  ER still limited in part due to restriction and also 2/2 surgical protocol.  Continued skilled PT services indicated to continue to progress improvements towards goals and full return to PLOF.    Personal Factors and Comorbidities Other   History of shoulder dislocation   Examination-Activity Limitations Bathing;Sleep;Carry;Lift;Reach Overhead    Examination-Participation Restrictions Cleaning;Community Activity;Driving    Stability/Clinical Decision Making Stable/Uncomplicated    Rehab Potential Good    PT Frequency 2x / week    PT Duration 6 weeks    PT Treatment/Interventions ADLs/Self Care Home Management;Electrical Stimulation;Iontophoresis 4mg /ml Dexamethasone;Moist Heat;Traction;Balance training;Therapeutic exercise;Therapeutic activities;Functional mobility training;Ultrasound;Neuromuscular re-education;Manual techniques;Vasopneumatic Device;Taping;Dry needling;Passive range of motion;Spinal Manipulations;Joint Manipulations    PT Next Visit Plan BFR, scapular muscle strengthening (lower trap, middle trap, SA).    PT Home Exercise Plan [id number]    Consulted and Agree with Plan of Care Patient           Patient will benefit from skilled therapeutic intervention in order to improve the following deficits and impairments:  Decreased endurance, Pain, Impaired UE functional use, Decreased strength, Decreased activity tolerance, Increased edema, Decreased mobility, Increased muscle spasms, Impaired perceived functional ability, Decreased coordination  Visit Diagnosis: Chronic left shoulder pain  Stiffness of left shoulder, not elsewhere  classified  Muscle weakness (generalized)     Problem List Patient Active Problem List   Diagnosis Date Noted  . 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 [place], [place], [place] Phone: [phone]   Fax:  [phone]  Name: Douglas Dixon MRN: Beatrice Lecher Date of Birth: April 19, 1996

## 2020-07-02 ENCOUNTER — Encounter: Payer: Self-pay | Admitting: Rehabilitative and Restorative Service Providers"

## 2020-07-02 ENCOUNTER — Ambulatory Visit: Payer: BC Managed Care – PPO | Admitting: Rehabilitative and Restorative Service Providers"

## 2020-07-02 ENCOUNTER — Other Ambulatory Visit: Payer: Self-pay

## 2020-07-02 DIAGNOSIS — G8929 Other chronic pain: Secondary | ICD-10-CM | POA: Diagnosis not present

## 2020-07-02 DIAGNOSIS — M25612 Stiffness of left shoulder, not elsewhere classified: Secondary | ICD-10-CM

## 2020-07-02 DIAGNOSIS — M6281 Muscle weakness (generalized): Secondary | ICD-10-CM

## 2020-07-02 DIAGNOSIS — M25512 Pain in left shoulder: Secondary | ICD-10-CM | POA: Diagnosis not present

## 2020-07-02 NOTE — Therapy (Signed)
[facility] Physical Therapy [address] [place], [place], [address] Phone: [phone]   Fax:  [phone]  Physical Therapy Treatment  Patient Details  Name: Douglas Dixon MRN: [id number] Date of Birth: 05/16/1996 Referring Provider (PT): Dr. August Saucer   Encounter Date: 07/02/2020   PT End of Session - 07/02/20 1441    Visit Number 8    Number of Visits 18    Date for PT Re-Evaluation 08/09/20    Authorization Type 30 visit limit per year    Authorization Time Period 06/28/2020 - 08/09/2020    Progress Note Due on Visit 17    PT Start Time 1430    PT Stop Time 1510    PT Time Calculation (min) 40 min    Activity Tolerance Patient tolerated treatment well    Behavior During Therapy [facility] for tasks assessed/performed           Past Medical History:  Diagnosis Date  . 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 Loose body in joint of shoulder region 04/2016   left  . Migraines   . Shoulder joint dislocation 04/2016   left  . Stuffy and runny nose 05/15/2016   clear drainage from nose, per pt.    Past Surgical History:  Procedure Laterality Date  . CHONDROPLASTY Left 05/22/2016   Procedure: CHONDROPLASTY;  Surgeon: Loreta Ave, MD;  Location: [facility];  Service: Orthopedics;  Laterality: Left;  . INGUINAL HERNIA REPAIR    . SHOULDER ARTHROSCOPY WITH BANKART REPAIR Left 05/22/2016   Procedure: LEFT SHOULDER ARTHROSCOPY WITH DEBRIDEMENT, BANKART REPAIR, EXCISION OF LOOSE BODY, CHONDROPLASTY;  Surgeon: Loreta Ave, MD;  Location: [facility];  Service: Orthopedics;  Laterality: Left;  . SHOULDER ARTHROSCOPY WITH ROTATOR CUFF REPAIR AND OPEN BICEPS TENODESIS Left 04/18/2020   Procedure: Left Shoulder Arthroscopy, Biceps Release,  Open Tenodesis;  Surgeon: Cammy Copa, MD;  Location: [facility];  Service: Orthopedics;  Laterality: Left;  . SHOULDER LATERJET Left 04/18/2020   Procedure:  Left Shoulder Laterjet;  Surgeon: Cammy Copa, MD;  [facility];  Service: Orthopedics;  Laterality: Left;  . THYROGLOSSAL DUCT CYST    . TONSILLECTOMY AND ADENOIDECTOMY  06/11/2001    There were no vitals filed for this visit.   Subjective Assessment - 07/02/20 1435    Subjective Pt. stated sore from exercises at times but overall not hurting.    Patient Stated Goals Return to work, usual recreational activity.    Currently in Pain? Yes    Pain Score 1     Pain Location Shoulder    Pain Orientation Left    Pain Descriptors / Indicators Sore    Pain Type Surgical pain;Chronic pain    Pain Onset More than a month ago    Pain Frequency Occasional    Aggravating Factors  sore after exercise    Pain Relieving Factors resting    Effect of Pain on Daily Activities work lifting/reaching                             [facility] Adult PT Treatment/Exercise - 07/02/20 0001      Exercises   Other Exercises  Lt UE BFR LOP 115, cuff size 2      Shoulder Exercises: Seated   Other Seated Exercises high rows seated 3 x 15      Shoulder Exercises: Prone  Other Prone Exercises quadruped y, t c BFR 65 mmHg 3 x 10 each bilateral      Shoulder Exercises: Sidelying   Other Sidelying Exercises reactive 2 lb ER in neutral c BFR 65 mmHg 30 sec x 5      Shoulder Exercises: Standing   Other Standing Exercises SA foam roller flexion 2 x 10, tband green ER c flexion punch 3 x 10      Shoulder Exercises: ROM/Strengthening   UBE (Upper Arm Bike) Lvl 2.5 5 mins fwd/back BFR 65 mmHg      Manual Therapy   Manual therapy comments active compression c movement Lt lat, contract/relax for ER mobility gains                        PT Long Term Goals - 06/28/20 1247      PT LONG TERM GOAL #1   Title Patient will demonstrate/report pain at worst less than or equal to 2/10 to facilitate minimal limitation in daily activity secondary to pain symptoms.      Time 6    Period Weeks    Status On-going    Target Date 08/09/20      PT LONG TERM GOAL #2   Title Patient will demonstrate independent use of home exercise program to facilitate ability to maintain/progress functional gains from skilled physical therapy services.    Time 6    Period Weeks    Status Achieved    Target Date 08/09/20      PT LONG TERM GOAL #3   Title Patient will demonstrate Lt GH joint mobility WFL to facilitate usual self care, dressing, reaching overhead at PLOF s limitation due to symptoms.    Time 6    Period Weeks    Status On-going    Target Date 08/09/20      PT LONG TERM GOAL #4   Title Patient will demonstrate Lt UE MMT 5/5 throughout to facilitate usual lifting, carrying in functional activity to PLOF s limitation.    Time 6    Period Weeks    Status On-going    Target Date 08/09/20      PT LONG TERM GOAL #5   Title Pt. will demonstrate/report ability to return work at [employer].    Time 6    Period Weeks    Status On-going    Target Date 08/09/20                 Plan - 07/02/20 1456    Clinical Impression Statement ER mobility improved c myofascial release techniques in manual today.  One rep strength continues to show improved but fatigue noted in sustained intervention.    Personal Factors and Comorbidities Other   History of shoulder dislocation   Examination-Activity Limitations Bathing;Sleep;Carry;Lift;Reach Overhead    Examination-Participation Restrictions Cleaning;Community Activity;Driving    Stability/Clinical Decision Making Stable/Uncomplicated    Rehab Potential Good    PT Frequency 2x / week    PT Duration 6 weeks    PT Treatment/Interventions ADLs/Self Care Home Management;Electrical Stimulation;Iontophoresis 4mg /ml Dexamethasone;Moist Heat;Traction;Balance training;Therapeutic exercise;Therapeutic activities;Functional mobility training;Ultrasound;Neuromuscular re-education;Manual techniques;Vasopneumatic Device;Taping;Dry  needling;Passive range of motion;Spinal Manipulations;Joint Manipulations    PT Next Visit Plan BFR, scapular muscle strengthening (lower trap, middle trap, SA) continued c additional focus on improving endurance.    PT Home Exercise Plan [id number]    Consulted and Agree with Plan of Care Patient           Patient  will benefit from skilled therapeutic intervention in order to improve the following deficits and impairments:  Decreased endurance, Pain, Impaired UE functional use, Decreased strength, Decreased activity tolerance, Increased edema, Decreased mobility, Increased muscle spasms, Impaired perceived functional ability, Decreased coordination  Visit Diagnosis: Chronic left shoulder pain  Stiffness of left shoulder, not elsewhere classified  Muscle weakness (generalized)     Problem List Patient Active Problem List   Diagnosis Date Noted  . 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 [place], [place], [address] Phone: [phone]   Fax:  [phone]  Name: MARQUEZE RAMCHARAN MRN: [id number] Date of Birth: [birth year]

## 2020-07-04 ENCOUNTER — Encounter: Payer: Self-pay | Admitting: Rehabilitative and Restorative Service Providers"

## 2020-07-04 ENCOUNTER — Ambulatory Visit (INDEPENDENT_AMBULATORY_CARE_PROVIDER_SITE_OTHER): Payer: BC Managed Care – PPO | Admitting: Orthopedic Surgery

## 2020-07-04 ENCOUNTER — Ambulatory Visit (INDEPENDENT_AMBULATORY_CARE_PROVIDER_SITE_OTHER): Payer: BC Managed Care – PPO | Admitting: Rehabilitative and Restorative Service Providers"

## 2020-07-04 ENCOUNTER — Other Ambulatory Visit: Payer: Self-pay

## 2020-07-04 DIAGNOSIS — M25612 Stiffness of left shoulder, not elsewhere classified: Secondary | ICD-10-CM | POA: Diagnosis not present

## 2020-07-04 DIAGNOSIS — M6281 Muscle weakness (generalized): Secondary | ICD-10-CM

## 2020-07-04 DIAGNOSIS — M25512 Pain in left shoulder: Secondary | ICD-10-CM | POA: Diagnosis not present

## 2020-07-04 DIAGNOSIS — M25312 Other instability, left shoulder: Secondary | ICD-10-CM

## 2020-07-04 DIAGNOSIS — G8929 Other chronic pain: Secondary | ICD-10-CM

## 2020-07-04 NOTE — Therapy (Signed)
[facility] Physical Therapy [address] [place], [place], [address] Phone: [phone]   Fax:  [phone]  Physical Therapy Treatment  Patient Details  Name: Douglas Dixon MRN: [id number] Date of Birth: [birth year] Referring Provider (PT): Dr. August Saucer   Encounter Date: 07/04/2020   PT End of Session - 07/04/20 1727    Visit Number 9    Number of Visits 18    Date for PT Re-Evaluation 08/09/20    Authorization Type 30 visit limit per year    Authorization Time Period 06/28/2020 - 08/09/2020    Progress Note Due on Visit 17    PT Start Time 1301    PT Stop Time 1345    PT Time Calculation (min) 44 min    Activity Tolerance Patient tolerated treatment well;No increased pain    Behavior During Therapy [facility]FL for tasks assessed/performed           Past Medical History:  Diagnosis Date  . 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 Loose body in joint of shoulder region 04/2016   left  . Migraines   . Shoulder joint dislocation 04/2016   left  . Stuffy and runny nose 05/15/2016   clear drainage from nose, per pt.    Past Surgical History:  Procedure Laterality Date  . CHONDROPLASTY Left 05/22/2016   Procedure: CHONDROPLASTY;  Surgeon: Loreta Ave, MD;  Location: [facility];  Service: Orthopedics;  Laterality: Left;  . INGUINAL HERNIA REPAIR    . SHOULDER ARTHROSCOPY WITH BANKART REPAIR Left 05/22/2016   Procedure: LEFT SHOULDER ARTHROSCOPY WITH DEBRIDEMENT, BANKART REPAIR, EXCISION OF LOOSE BODY, CHONDROPLASTY;  Surgeon: Loreta Ave, MD;  Location: [facility];  Service: Orthopedics;  Laterality: Left;  . SHOULDER ARTHROSCOPY WITH ROTATOR CUFF REPAIR AND OPEN BICEPS TENODESIS Left 04/18/2020   Procedure: Left Shoulder Arthroscopy, Biceps Release,  Open Tenodesis;  Surgeon: Cammy Copa, MD;  Location: [facility];  Service: Orthopedics;  Laterality: Left;  . SHOULDER LATERJET Left  04/18/2020   Procedure: Left Shoulder Laterjet;  Surgeon: Cammy Copa, MD;  Location:  [facility];  Service: Orthopedics;  Laterality: Left;  . THYROGLOSSAL DUCT CYST    . TONSILLECTOMY AND ADENOIDECTOMY  06/11/2001    There were no vitals filed for this visit.   Subjective Assessment - 07/04/20 1524    Subjective Douglas Dixon remains sore from exercises at times but overall not hurting.    Patient Stated Goals Return to work, usual recreational activity.    Currently in Pain? No/denies    Pain Location Shoulder    Pain Orientation Left    Pain Descriptors / Indicators Sore    Pain Type Surgical pain;Chronic pain    Pain Onset More than a month ago    Pain Frequency Occasional    Aggravating Factors  Sore with activity/exercise    Pain Relieving Factors Rest    Effect of Pain on Daily Activities Limits lifting, reaching and anything behind him (ER)    Multiple Pain Sites No              [facility] PT Assessment - 07/04/20 0001      ROM / Strength   AROM / PROM / Strength AROM;Strength      AROM   Overall AROM  Deficits    AROM Assessment Site Shoulder    Right/Left Shoulder Left;Right    Right Shoulder Flexion 180 Degrees    Right Shoulder Internal Rotation  70 Degrees    Right Shoulder External Rotation 120 Degrees    Right Shoulder Horizontal ABduction 55 Degrees    Left Shoulder Flexion 160 Degrees    Left Shoulder Internal Rotation 70 Degrees    Left Shoulder External Rotation 60 Degrees    Left Shoulder Horizontal ADduction 35 Degrees      Strength   Left Shoulder Internal Rotation 4+/5    Left Shoulder External Rotation 4+/5                         [facility] Adult PT Treatment/Exercise - 07/04/20 0001      Neuro Re-ed    Neuro Re-ed Details  Rhythmic stabilizations in neutral and slight IR; ball circles on wall at 90 degrees 3X 1 minute      Shoulder Exercises: Standing   Protraction Strengthening;20 reps;Theraband    Theraband Level  (Shoulder Protraction) Level 4 (Blue)    External Rotation Strengthening;10 reps;Theraband   3 sets    Theraband Level (Shoulder External Rotation) Level 3 (Green)    External Rotation Limitations Stay in comfortable range (no more than about 30 degrees of ER)    Internal Rotation Strengthening;10 reps;Theraband   2 sets    Theraband Level (Shoulder Internal Rotation) Level 3 (Green)    Internal Rotation Limitations No more than 30 degrees of ER    Row Strengthening;20 reps;Theraband    Theraband Level (Shoulder Row) Level 4 (Blue)                  PT Education - 07/04/20 1726    Education Details Reviewed and updated technique with his HEP.    Person(s) Educated Patient    Methods Explanation;Demonstration;Verbal cues    Comprehension Verbalized understanding;Returned demonstration;Verbal cues required;Need further instruction               PT Long Term Goals - 06/28/20 1247      PT LONG TERM GOAL #1   Title Patient will demonstrate/report pain at worst less than or equal to 2/10 to facilitate minimal limitation in daily activity secondary to pain symptoms.    Time 6    Period Weeks    Status On-going    Target Date 08/09/20      PT LONG TERM GOAL #2   Title Patient will demonstrate independent use of home exercise program to facilitate ability to maintain/progress functional gains from skilled physical therapy services.    Time 6    Period Weeks    Status Achieved    Target Date 08/09/20      PT LONG TERM GOAL #3   Title Patient will demonstrate Lt GH joint mobility WFL to facilitate usual self care, dressing, reaching overhead at PLOF s limitation due to symptoms.    Time 6    Period Weeks    Status On-going    Target Date 08/09/20      PT LONG TERM GOAL #4   Title Patient will demonstrate Lt UE MMT 5/5 throughout to facilitate usual lifting, carrying in functional activity to PLOF s limitation.    Time 6    Period Weeks    Status On-going    Target  Date 08/09/20      PT LONG TERM GOAL #5   Title Pt. will demonstrate/report ability to return work at [employer].    Time 6    Period Weeks    Status On-going    Target Date 08/09/20  Plan - 07/04/20 1728    Clinical Impression Statement Douglas Dixon is moving better and demonstrating increased strength as compared to the last time I saw him.  We reviewed the importance of staying within his comfortable range with ER due to his stage of healing.  Continue AROM progressions and strengthening to appropriately improve his left shoulder function.    Personal Factors and Comorbidities Other;Fitness   History of shoulder dislocation   Examination-Activity Limitations Bathing;Sleep;Carry;Lift;Reach Overhead    Examination-Participation Restrictions Cleaning;Community Activity;Driving    Stability/Clinical Decision Making Stable/Uncomplicated    Rehab Potential Good    PT Frequency 2x / week    PT Duration 6 weeks    PT Treatment/Interventions ADLs/Self Care Home Management;Electrical Stimulation;Iontophoresis 4mg /ml Dexamethasone;Moist Heat;Traction;Balance training;Therapeutic exercise;Therapeutic activities;Functional mobility training;Ultrasound;Neuromuscular re-education;Manual techniques;Vasopneumatic Device;Taping;Dry needling;Passive range of motion;Spinal Manipulations;Joint Manipulations    PT Next Visit Plan BFR, scapular muscle strengthening (lower trap, middle trap, SA) continued c additional focus on improving endurance.    PT Home Exercise Plan [id number]    Consulted and Agree with Plan of Care Patient           Patient will benefit from skilled therapeutic intervention in order to improve the following deficits and impairments:  Decreased endurance, Pain, Impaired UE functional use, Decreased strength, Decreased activity tolerance, Increased edema, Decreased mobility, Increased muscle spasms, Impaired perceived functional ability, Decreased coordination  Visit  Diagnosis: Chronic left shoulder pain  Stiffness of left shoulder, not elsewhere classified  Muscle weakness (generalized)     Problem List Patient Active Problem List   Diagnosis Date Noted  . 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 [place], [place], [place] Phone: [phone]   Fax:  [phone]  Name: Douglas Dixon MRN: Beatrice Lecher Date of Birth: 1996/12/12

## 2020-07-04 NOTE — Patient Instructions (Signed)
Instructions to go slow with ER and stay in his comfortable range with ER AROM.

## 2020-07-08 ENCOUNTER — Encounter: Payer: Self-pay | Admitting: Orthopedic Surgery

## 2020-07-08 NOTE — Progress Notes (Signed)
Post-Op Visit Note   Patient: Douglas Dixon           Date of Birth: July 30, 1996           MRN: [id number] Visit Date: 07/04/2020 PCP: Gareth Morgan, MD   Assessment & Plan:  Chief Complaint:  Chief Complaint  Patient presents with  . Left Shoulder - Follow-up   Visit Diagnoses:  1. Instability of left shoulder joint     Plan: Patient is a 24 year old male who presents s/p left shoulder latarjet procedure.  Patient is doing well overall.  He has been going to physical therapy 2 times a week.  He has his ninth visit today.  He is also doing a home exercise program 2 times a day.  He notes difficulty with sleep most nights but overall this is improving.  He has had no instability events since the procedure.   He does have some crepitus on exam that seems to be over the subscapularis.  He has excellent strength with the subscap but mild pain on subscapularis resistance testing.  May be some resolving scar tissue from the subscapularis split during the procedure.  On exam patient has 35 degrees external rotation, 100 degrees abduction, 170 degrees forward flexion.  He is progressing very well.  Pleased with progress.  Plan to continue physical therapy 1-2 times per week for the next 6 weeks.  Follow-up in 8 weeks for clinical recheck with repeat x-rays on return.  Patient agreed with plan.  Follow-Up Instructions: No follow-ups on file.   Orders:  No orders of the defined types were placed in this encounter.  No orders of the defined types were placed in this encounter.   Imaging: No results found.  PMFS History: Patient Active Problem List   Diagnosis Date Noted  . 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 Loose body in joint of shoulder region 04/2016   left  . Migraines   . Shoulder joint dislocation 04/2016   left  . Stuffy and runny nose 05/15/2016   clear drainage  from nose, per pt.    No family history on file.  Past Surgical History:  Procedure Laterality Date  . CHONDROPLASTY Left 05/22/2016   Procedure: CHONDROPLASTY;  Surgeon: Loreta Ave, MD;  Location: [facility];  Service: Orthopedics;  Laterality: Left;  . INGUINAL HERNIA REPAIR    . SHOULDER ARTHROSCOPY WITH BANKART REPAIR Left 05/22/2016   Procedure: LEFT SHOULDER ARTHROSCOPY WITH DEBRIDEMENT, BANKART REPAIR, EXCISION OF LOOSE BODY, CHONDROPLASTY;  Surgeon: Loreta Ave, MD;  Location: [facility];  Service: Orthopedics;  Laterality: Left;  . SHOULDER ARTHROSCOPY WITH ROTATOR CUFF REPAIR AND OPEN BICEPS TENODESIS Left 04/18/2020   Procedure: Left Shoulder Arthroscopy, Biceps Release,  Open Tenodesis;  Surgeon: Cammy Copa, MD;  Location: [facility];  Service: Orthopedics;  Laterality: Left;  . SHOULDER LATERJET Left 04/18/2020   Procedure: Left Shoulder Laterjet;  Surgeon: Cammy Copa, MD;  Location: [facility];  Service: Orthopedics;  Laterality: Left;  . THYROGLOSSAL DUCT CYST    . TONSILLECTOMY AND ADENOIDECTOMY  06/11/2001   Social History   Occupational History  . Not on file  Tobacco Use  . Smoking status: Never Smoker  . Smokeless tobacco: Never Used  Substance and Sexual Activity  . Alcohol use: Yes    Comment: occasionally  .  Drug use: No  . Sexual activity: Not on file

## 2020-07-17 ENCOUNTER — Other Ambulatory Visit: Payer: Self-pay

## 2020-07-17 ENCOUNTER — Ambulatory Visit (INDEPENDENT_AMBULATORY_CARE_PROVIDER_SITE_OTHER): Payer: BC Managed Care – PPO | Admitting: Rehabilitative and Restorative Service Providers"

## 2020-07-17 ENCOUNTER — Encounter: Payer: Self-pay | Admitting: Rehabilitative and Restorative Service Providers"

## 2020-07-17 DIAGNOSIS — G8929 Other chronic pain: Secondary | ICD-10-CM

## 2020-07-17 DIAGNOSIS — M6281 Muscle weakness (generalized): Secondary | ICD-10-CM | POA: Diagnosis not present

## 2020-07-17 DIAGNOSIS — M25512 Pain in left shoulder: Secondary | ICD-10-CM

## 2020-07-17 DIAGNOSIS — M25612 Stiffness of left shoulder, not elsewhere classified: Secondary | ICD-10-CM

## 2020-07-17 NOTE — Therapy (Signed)
[facility] Physical Therapy [address] [place], [place], [address] Phone: [phone]   Fax:  [phone]  Physical Therapy Treatment  Patient Details  Name: Douglas Dixon MRN: [id number] Date of Birth: [birth year] Referring Provider (PT): Dr. August Saucer   Encounter Date: 07/17/2020   PT End of Session - 07/17/20 1249    Visit Number 10    Number of Visits 18    Date for PT Re-Evaluation 08/09/20    Authorization Type 30 visit limit per year    Authorization Time Period 06/28/2020 - 08/09/2020    Progress Note Due on Visit 17    PT Start Time 1300    PT Stop Time 1343    PT Time Calculation (min) 43 min    Activity Tolerance Patient tolerated treatment well    Behavior During Therapy [facility] for tasks assessed/performed           Past Medical History:  Diagnosis Date  . 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 Loose body in joint of shoulder region 04/2016   left  . Migraines   . Shoulder joint dislocation 04/2016   left  . Stuffy and runny nose 05/15/2016   clear drainage from nose, per pt.    Past Surgical History:  Procedure Laterality Date  . CHONDROPLASTY Left 05/22/2016   Procedure: CHONDROPLASTY;  Surgeon: Loreta Ave, MD;  Location: [facility];  Service: Orthopedics;  Laterality: Left;  . INGUINAL HERNIA REPAIR    . SHOULDER ARTHROSCOPY WITH BANKART REPAIR Left 05/22/2016   Procedure: LEFT SHOULDER ARTHROSCOPY WITH DEBRIDEMENT, BANKART REPAIR, EXCISION OF LOOSE BODY, CHONDROPLASTY;  Surgeon: Loreta Ave, MD;  Location: [facility];  Service: Orthopedics;  Laterality: Left;  . SHOULDER ARTHROSCOPY WITH ROTATOR CUFF REPAIR AND OPEN BICEPS TENODESIS Left 04/18/2020   Procedure: Left Shoulder Arthroscopy, Biceps Release,  Open Tenodesis;  Surgeon: Cammy Copa, MD;  Location: [facility];  Service: Orthopedics;  Laterality: Left;  . SHOULDER LATERJET Left 04/18/2020   Procedure:  Left Shoulder Laterjet;  Surgeon: Cammy Copa, MD;  Location: [facility];  Service: Orthopedics;  Laterality: Left;  . THYROGLOSSAL DUCT CYST    . TONSILLECTOMY AND ADENOIDECTOMY  06/11/2001    There were no vitals filed for this visit.   Subjective Assessment - 07/17/20 1303    Subjective Pt. indicated shoulder feeling ok.  Pt. stated his grandmother died yesterday so last week has been busy and not easy to do for shoulder at this time.    Patient Stated Goals Return to work, usual recreational activity.    Currently in Pain? No/denies    Pain Score 0-No pain    Pain Onset More than a month ago                             [facility] Adult PT Treatment/Exercise - 07/17/20 0001      Shoulder Exercises: Prone   Other Prone Exercises prone y to t to y 3 x 10 bilateral over pball 2 lbs    Other Prone Exercises qruped SA single arm 5 sec hold x 15      Shoulder Exercises: Sidelying   Other Sidelying Exercises reactive eccentric ER 2 lb ball 30 sec x 5      Shoulder Exercises: Standing   External Rotation Strengthening;Left   3 x 10 green c flexion punch   Theraband Level (Shoulder  External Rotation) Level 3 (Green)    Other Standing Exercises SA foam roller flexion c ER hold red band 2 x 15    Other Standing Exercises standing twist on /off baps 2 mins various heights Lt UE      Shoulder Exercises: ROM/Strengthening   UBE (Upper Arm Bike) Lvl 3 3 mins fwd/back each                        PT Long Term Goals - 06/28/20 1247      PT LONG TERM GOAL #1   Title Patient will demonstrate/report pain at worst less than or equal to 2/10 to facilitate minimal limitation in daily activity secondary to pain symptoms.    Time 6    Period Weeks    Status On-going    Target Date 08/09/20      PT LONG TERM GOAL #2   Title Patient will demonstrate independent use of home exercise program to facilitate ability to maintain/progress functional gains  from skilled physical therapy services.    Time 6    Period Weeks    Status Achieved    Target Date 08/09/20      PT LONG TERM GOAL #3   Title Patient will demonstrate Lt GH joint mobility WFL to facilitate usual self care, dressing, reaching overhead at PLOF s limitation due to symptoms.    Time 6    Period Weeks    Status On-going    Target Date 08/09/20      PT LONG TERM GOAL #4   Title Patient will demonstrate Lt UE MMT 5/5 throughout to facilitate usual lifting, carrying in functional activity to PLOF s limitation.    Time 6    Period Weeks    Status On-going    Target Date 08/09/20      PT LONG TERM GOAL #5   Title Pt. will demonstrate/report ability to return work at [employer].    Time 6    Period Weeks    Status On-going    Target Date 08/09/20                 Plan - 07/17/20 1324    Clinical Impression Statement Notable improvement in ER mobility at this time, showing > 60 deg in scaption positoining.  Continued multidirectional strength/control progression indicated.    Personal Factors and Comorbidities Other;Fitness   History of shoulder dislocation   Examination-Activity Limitations Bathing;Sleep;Carry;Lift;Reach Overhead    Examination-Participation Restrictions Cleaning;Community Activity;Driving    Stability/Clinical Decision Making Stable/Uncomplicated    Rehab Potential Good    PT Frequency 2x / week    PT Duration 6 weeks    PT Treatment/Interventions ADLs/Self Care Home Management;Electrical Stimulation;Iontophoresis 4mg /ml Dexamethasone;Moist Heat;Traction;Balance training;Therapeutic exercise;Therapeutic activities;Functional mobility training;Ultrasound;Neuromuscular re-education;Manual techniques;Vasopneumatic Device;Taping;Dry needling;Passive range of motion;Spinal Manipulations;Joint Manipulations    PT Next Visit Plan 1-2 x/week strengthening focus for next few weeks c BFR periodically.    PT Home Exercise Plan [id number]    Consulted and Agree  with Plan of Care Patient           Patient will benefit from skilled therapeutic intervention in order to improve the following deficits and impairments:  Decreased endurance, Pain, Impaired UE functional use, Decreased strength, Decreased activity tolerance, Increased edema, Decreased mobility, Increased muscle spasms, Impaired perceived functional ability, Decreased coordination  Visit Diagnosis: Chronic left shoulder pain  Stiffness of left shoulder, not elsewhere classified  Muscle weakness (generalized)     Problem  List Patient Active Problem List   Diagnosis Date Noted  . 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 [place], [place], [address] Phone: [phone]   Fax:  [phone]  Name: Douglas Dixon MRN: [id number] Date of Birth: 11-07-1996

## 2020-07-19 ENCOUNTER — Other Ambulatory Visit: Payer: Self-pay

## 2020-07-19 ENCOUNTER — Ambulatory Visit (INDEPENDENT_AMBULATORY_CARE_PROVIDER_SITE_OTHER): Payer: BC Managed Care – PPO | Admitting: Rehabilitative and Restorative Service Providers"

## 2020-07-19 ENCOUNTER — Encounter: Payer: Self-pay | Admitting: Rehabilitative and Restorative Service Providers"

## 2020-07-19 DIAGNOSIS — M6281 Muscle weakness (generalized): Secondary | ICD-10-CM | POA: Diagnosis not present

## 2020-07-19 DIAGNOSIS — G8929 Other chronic pain: Secondary | ICD-10-CM

## 2020-07-19 DIAGNOSIS — M25612 Stiffness of left shoulder, not elsewhere classified: Secondary | ICD-10-CM

## 2020-07-19 DIAGNOSIS — M25512 Pain in left shoulder: Secondary | ICD-10-CM

## 2020-07-19 NOTE — Therapy (Signed)
[facility] Physical Therapy [address] [place], [place], [address] Phone: [phone]   Fax:  [phone]  Physical Therapy Treatment  Patient Details  Name: Douglas Dixon MRN: [id number] Date of Birth: 04-24-1996 Referring Provider (PT): Dr. August Saucer   Encounter Date: 07/19/2020   PT End of Session - 07/19/20 1308    Visit Number 11    Number of Visits 18    Date for PT Re-Evaluation 08/09/20    Authorization Type 30 visit limit per year    Authorization Time Period 06/28/2020 - 08/09/2020    Progress Note Due on Visit 17    PT Start Time 1300    PT Stop Time 1340    PT Time Calculation (min) 40 min    Activity Tolerance Patient tolerated treatment well    Behavior During Therapy [facility] for tasks assessed/performed           Past Medical History:  Diagnosis Date  . 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 Loose body in joint of shoulder region 04/2016   left  . Migraines   . Shoulder joint dislocation 04/2016   left  . Stuffy and runny nose 05/15/2016   clear drainage from nose, per pt.    Past Surgical History:  Procedure Laterality Date  . CHONDROPLASTY Left 05/22/2016   Procedure: CHONDROPLASTY;  Surgeon: Loreta Ave, MD;  Location: [facility];  Service: Orthopedics;  Laterality: Left;  . INGUINAL HERNIA REPAIR    . SHOULDER ARTHROSCOPY WITH BANKART REPAIR Left 05/22/2016   Procedure: LEFT SHOULDER ARTHROSCOPY WITH DEBRIDEMENT, BANKART REPAIR, EXCISION OF LOOSE BODY, CHONDROPLASTY;  Surgeon: Loreta Ave, MD;  Location: [facility];  Service: Orthopedics;  Laterality: Left;  . SHOULDER ARTHROSCOPY WITH ROTATOR CUFF REPAIR AND OPEN BICEPS TENODESIS Left 04/18/2020   Procedure: Left Shoulder Arthroscopy, Biceps Release,  Open Tenodesis;  Surgeon: Cammy Copa, MD;  Location: [facility];  Service: Orthopedics;  Laterality: Left;  . SHOULDER LATERJET Left 04/18/2020   Procedure:  Left Shoulder Laterjet;  Surgeon: Cammy Copa, MD;  [facility];  Service: Orthopedics;  Laterality: Left;  . THYROGLOSSAL DUCT CYST    . TONSILLECTOMY AND ADENOIDECTOMY  06/11/2001    There were no vitals filed for this visit.   Subjective Assessment - 07/19/20 1307    Subjective Pt. indicated no specific complaints upon arrival today.    Patient Stated Goals Return to work, usual recreational activity.    Currently in Pain? No/denies    Pain Score 0-No pain    Pain Onset More than a month ago                             [facility] Adult PT Treatment/Exercise - 07/19/20 0001      Shoulder Exercises: Supine   Other Supine Exercises BAPS board circles off/on (filter change replication) 2:30 mins      Shoulder Exercises: Prone   Other Prone Exercises prone y over ball c red band hold outs 3 x 10      Shoulder Exercises: Standing   External Rotation Strengthening;Left   2 x 10 c er, flexion punch and rotation in 90/90   Theraband Level (Shoulder External Rotation) Level 3 (Green)    ABduction Strengthening;Both   125 deg c anterior green band resistance   Theraband Level (Shoulder ABduction) Level 3 (Green)    Other Standing Exercises  d2 extension green band 3 x 10    Other Standing Exercises wall push up on pball 6x5      Shoulder Exercises: ROM/Strengthening   UBE (Upper Arm Bike) Lvl 3.5 5 mins fwd/back each way c 10 sec intervals each minute                       PT Long Term Goals - 06/28/20 1247      PT LONG TERM GOAL #1   Title Patient will demonstrate/report pain at worst less than or equal to 2/10 to facilitate minimal limitation in daily activity secondary to pain symptoms.    Time 6    Period Weeks    Status On-going    Target Date 08/09/20      PT LONG TERM GOAL #2   Title Patient will demonstrate independent use of home exercise program to facilitate ability to maintain/progress functional gains from  skilled physical therapy services.    Time 6    Period Weeks    Status Achieved    Target Date 08/09/20      PT LONG TERM GOAL #3   Title Patient will demonstrate Lt GH joint mobility WFL to facilitate usual self care, dressing, reaching overhead at PLOF s limitation due to symptoms.    Time 6    Period Weeks    Status On-going    Target Date 08/09/20      PT LONG TERM GOAL #4   Title Patient will demonstrate Lt UE MMT 5/5 throughout to facilitate usual lifting, carrying in functional activity to PLOF s limitation.    Time 6    Period Weeks    Status On-going    Target Date 08/09/20      PT LONG TERM GOAL #5   Title Pt. will demonstrate/report ability to return work at [employer].    Time 6    Period Weeks    Status On-going    Target Date 08/09/20                 Plan - 07/19/20 1337    Clinical Impression Statement Fatigue most notable impairment today in intervention.  Making good progression in initial control on various interventions.   Continued overhead reaching activity indicated to progress ability in work activity.    Personal Factors and Comorbidities Other;Fitness   History of shoulder dislocation   Examination-Activity Limitations Bathing;Sleep;Carry;Lift;Reach Overhead    Examination-Participation Restrictions Cleaning;Community Activity;Driving    Stability/Clinical Decision Making Stable/Uncomplicated    Rehab Potential Good    PT Frequency 2x / week    PT Duration 6 weeks    PT Treatment/Interventions ADLs/Self Care Home Management;Electrical Stimulation;Iontophoresis 4mg /ml Dexamethasone;Moist Heat;Traction;Balance training;Therapeutic exercise;Therapeutic activities;Functional mobility training;Ultrasound;Neuromuscular re-education;Manual techniques;Vasopneumatic Device;Taping;Dry needling;Passive range of motion;Spinal Manipulations;Joint Manipulations    PT Next Visit Plan Overhead reaching strength training.    PT Home Exercise Plan [id number]     Consulted and Agree with Plan of Care Patient           Patient will benefit from skilled therapeutic intervention in order to improve the following deficits and impairments:  Decreased endurance, Pain, Impaired UE functional use, Decreased strength, Decreased activity tolerance, Increased edema, Decreased mobility, Increased muscle spasms, Impaired perceived functional ability, Decreased coordination  Visit Diagnosis: Chronic left shoulder pain  Stiffness of left shoulder, not elsewhere classified  Muscle weakness (generalized)     Problem List Patient Active Problem List   Diagnosis Date Noted  . Hill  Sachs deformity, left 05/22/2016    Chyrel Masson, PT, DPT, OCS, ATC 07/19/20  1:40 PM    [facility] Physical Therapy [address] [place], [place], [address] Phone: [phone]   Fax:  [phone]  Name: Douglas Dixon MRN: [id number] Date of Birth: 05-11-1996

## 2020-07-24 ENCOUNTER — Ambulatory Visit (INDEPENDENT_AMBULATORY_CARE_PROVIDER_SITE_OTHER): Payer: BC Managed Care – PPO | Admitting: Rehabilitative and Restorative Service Providers"

## 2020-07-24 ENCOUNTER — Other Ambulatory Visit: Payer: Self-pay

## 2020-07-24 DIAGNOSIS — M25612 Stiffness of left shoulder, not elsewhere classified: Secondary | ICD-10-CM

## 2020-07-24 DIAGNOSIS — M25512 Pain in left shoulder: Secondary | ICD-10-CM

## 2020-07-24 DIAGNOSIS — M6281 Muscle weakness (generalized): Secondary | ICD-10-CM | POA: Diagnosis not present

## 2020-07-24 DIAGNOSIS — G8929 Other chronic pain: Secondary | ICD-10-CM | POA: Diagnosis not present

## 2020-07-24 NOTE — Therapy (Signed)
[facility] Physical Therapy [address] [place], [place], [address] Phone: [phone]   Fax:  [phone]  Physical Therapy Treatment  Patient Details  Name: Douglas Dixon MRN: [id number] Date of Birth: 07-10-1996 Referring Provider (PT): Dr. August Saucer   Encounter Date: 07/24/2020   PT End of Session - 07/24/20 1304    Visit Number 12    Number of Visits 18    Date for PT Re-Evaluation 08/09/20    Authorization Type 30 visit limit per year    Authorization Time Period 06/28/2020 - 08/09/2020    Progress Note Due on Visit 17    PT Start Time 1300    PT Stop Time 1340    PT Time Calculation (min) 40 min    Activity Tolerance Patient tolerated treatment well    Behavior During Therapy [facility] for tasks assessed/performed           Past Medical History:  Diagnosis Date  . 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 Loose body in joint of shoulder region 04/2016   left  . Migraines   . Shoulder joint dislocation 04/2016   left  . Stuffy and runny nose 05/15/2016   clear drainage from nose, per pt.    Past Surgical History:  Procedure Laterality Date  . CHONDROPLASTY Left 05/22/2016   Procedure: CHONDROPLASTY;  Surgeon: Loreta Ave, MD;  Location: [facility];  Service: Orthopedics;  Laterality: Left;  . INGUINAL HERNIA REPAIR    . SHOULDER ARTHROSCOPY WITH BANKART REPAIR Left 05/22/2016   Procedure: LEFT SHOULDER ARTHROSCOPY WITH DEBRIDEMENT, BANKART REPAIR, EXCISION OF LOOSE BODY, CHONDROPLASTY;  Surgeon: Loreta Ave, MD;  Location: [facility];  Service: Orthopedics;  Laterality: Left;  . SHOULDER ARTHROSCOPY WITH ROTATOR CUFF REPAIR AND OPEN BICEPS TENODESIS Left 04/18/2020   Procedure: Left Shoulder Arthroscopy, Biceps Release,  Open Tenodesis;  Surgeon: Cammy Copa, MD;  Location: [facility];  Service: Orthopedics;  Laterality: Left;  . SHOULDER LATERJET Left 04/18/2020   Procedure:  Left Shoulder Laterjet;  Surgeon: Cammy Copa, MD;  Location:  S[facility];  Service: Orthopedics;  Laterality: Left;  . THYROGLOSSAL DUCT CYST    . TONSILLECTOMY AND ADENOIDECTOMY  06/11/2001    There were no vitals filed for this visit.   Subjective Assessment - 07/24/20 1305    Subjective Pt. stated no trouble for face from dropping weight on it last visit.  Pt. stated arm feels tight at end range reaching behind back but not painful.    Patient Stated Goals Return to work, usual recreational activity.    Currently in Pain? No/denies    Pain Score 0-No pain    Pain Onset More than a month ago                             [facility] Adult PT Treatment/Exercise - 07/24/20 0001      Neuro Re-ed    Neuro Re-ed Details  pball 90 deg flexion hold c external perts30 sec x4, wall push up on pball for stability 3x 10      Shoulder Exercises: Standing   External Rotation Strengthening;Left   3 x 15 c flexion punch and c body rotation   Theraband Level (Shoulder External Rotation) Level 4 (Blue)    Other Standing Exercises d2 extension green band 3 x 10    Other Standing Exercises standing abduction green band anterior  resistance to 115 deg 3 x 10      Shoulder Exercises: ROM/Strengthening   UBE (Upper Arm Bike) Lvl 4 5 mins fwd/back each way                       PT Long Term Goals - 07/24/20 1331      PT LONG TERM GOAL #1   Title Patient will demonstrate/report pain at worst less than or equal to 2/10 to facilitate minimal limitation in daily activity secondary to pain symptoms.    Time 6    Period Weeks    Status On-going    Target Date 08/09/20      PT LONG TERM GOAL #2   Title Patient will demonstrate independent use of home exercise program to facilitate ability to maintain/progress functional gains from skilled physical therapy services.    Time 6    Period Weeks    Status Achieved      PT LONG TERM GOAL #3   Title Patient  will demonstrate Lt GH joint mobility WFL to facilitate usual self care, dressing, reaching overhead at PLOF s limitation due to symptoms.    Time 6    Period Weeks    Status On-going    Target Date 08/09/20      PT LONG TERM GOAL #4   Title Patient will demonstrate Lt UE MMT 5/5 throughout to facilitate usual lifting, carrying in functional activity to PLOF s limitation.    Time 6    Period Weeks    Status On-going    Target Date 08/09/20      PT LONG TERM GOAL #5   Title Pt. will demonstrate/report ability to return work at [employer].    Time 6    Period Weeks    Status On-going    Target Date 08/09/20                 Plan - 07/24/20 1329    Clinical Impression Statement Continued evidence of fatigue noted in combination movements/diagonals.  Proprioceptive control fair but showing some progression.  Continued progression in strength, control and endurance indicated at this time.    Personal Factors and Comorbidities Other;Fitness   History of shoulder dislocation   Examination-Activity Limitations Bathing;Sleep;Carry;Lift;Reach Overhead    Examination-Participation Restrictions Cleaning;Community Activity;Driving    Stability/Clinical Decision Making Stable/Uncomplicated    Rehab Potential Good    PT Frequency 2x / week    PT Duration 6 weeks    PT Treatment/Interventions ADLs/Self Care Home Management;Electrical Stimulation;Iontophoresis 4mg /ml Dexamethasone;Moist Heat;Traction;Balance training;Therapeutic exercise;Therapeutic activities;Functional mobility training;Ultrasound;Neuromuscular re-education;Manual techniques;Vasopneumatic Device;Taping;Dry needling;Passive range of motion;Spinal Manipulations;Joint Manipulations    PT Next Visit Plan Overhead reaching strength training , control improvement    PT Home Exercise Plan [id number]    Consulted and Agree with Plan of Care Patient           Patient will benefit from skilled therapeutic intervention in order to  improve the following deficits and impairments:  Decreased endurance, Pain, Impaired UE functional use, Decreased strength, Decreased activity tolerance, Increased edema, Decreased mobility, Increased muscle spasms, Impaired perceived functional ability, Decreased coordination  Visit Diagnosis: Chronic left shoulder pain  Stiffness of left shoulder, not elsewhere classified  Muscle weakness (generalized)     Problem List Patient Active Problem List   Diagnosis Date Noted  . 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

## 2020-07-27 ENCOUNTER — Encounter: Payer: Self-pay | Admitting: Rehabilitative and Restorative Service Providers"

## 2020-07-27 ENCOUNTER — Ambulatory Visit (INDEPENDENT_AMBULATORY_CARE_PROVIDER_SITE_OTHER): Payer: BC Managed Care – PPO | Admitting: Rehabilitative and Restorative Service Providers"

## 2020-07-27 ENCOUNTER — Other Ambulatory Visit: Payer: Self-pay

## 2020-07-27 DIAGNOSIS — M6281 Muscle weakness (generalized): Secondary | ICD-10-CM | POA: Diagnosis not present

## 2020-07-27 DIAGNOSIS — M25512 Pain in left shoulder: Secondary | ICD-10-CM

## 2020-07-27 DIAGNOSIS — G8929 Other chronic pain: Secondary | ICD-10-CM | POA: Diagnosis not present

## 2020-07-27 DIAGNOSIS — M25612 Stiffness of left shoulder, not elsewhere classified: Secondary | ICD-10-CM

## 2020-07-27 NOTE — Therapy (Signed)
[facility] Physical Therapy [address] [place], [place], [address] Phone: [phone]   Fax:  [phone]  Physical Therapy Treatment  Patient Details  Name: Douglas Dixon MRN: [id number] Date of Birth: 19-Aug-1996 Referring Provider (PT): Dr. August Saucer   Encounter Date: 07/27/2020   PT End of Session - 07/27/20 1339    Visit Number 13    Number of Visits 18    Date for PT Re-Evaluation 08/09/20    Authorization Type 30 visit limit per year    Authorization Time Period 06/28/2020 - 08/09/2020    Progress Note Due on Visit 17    PT Start Time 1342    PT Stop Time 1423    PT Time Calculation (min) 41 min    Activity Tolerance Patient tolerated treatment well    Behavior During Therapy [facility] for tasks assessed/performed           Past Medical History:  Diagnosis Date  . 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 Loose body in joint of shoulder region 04/2016   left  . Migraines   . Shoulder joint dislocation 04/2016   left  . Stuffy and runny nose 05/15/2016   clear drainage from nose, per pt.    Past Surgical History:  Procedure Laterality Date  . CHONDROPLASTY Left 05/22/2016   Procedure: CHONDROPLASTY;  Surgeon: Loreta Ave, MD;  Location: [facility];  Service: Orthopedics;  Laterality: Left;  . INGUINAL HERNIA REPAIR    . SHOULDER ARTHROSCOPY WITH BANKART REPAIR Left 05/22/2016   Procedure: LEFT SHOULDER ARTHROSCOPY WITH DEBRIDEMENT, BANKART REPAIR, EXCISION OF LOOSE BODY, CHONDROPLASTY;  Surgeon: Loreta Ave, MD;  Location: [facility];  Service: Orthopedics;  Laterality: Left;  . SHOULDER ARTHROSCOPY WITH ROTATOR CUFF REPAIR AND OPEN BICEPS TENODESIS Left 04/18/2020   Procedure: Left Shoulder Arthroscopy, Biceps Release,  Open Tenodesis;  Surgeon: Cammy Copa, MD;  Location: [facility];  Service: Orthopedics;  Laterality: Left;  . SHOULDER LATERJET Left 04/18/2020   Procedure:  Left Shoulder Laterjet;  Surgeon: Cammy Copa, MD;  Location: [facility];  Service: Orthopedics;  Laterality: Left;  . THYROGLOSSAL DUCT CYST    . TONSILLECTOMY AND ADENOIDECTOMY  06/11/2001    There were no vitals filed for this visit.   Subjective Assessment - 07/27/20 1343    Subjective Pt. stated no pain today upon arrival, just sore at times.    Patient Stated Goals Return to work, usual recreational activity.    Currently in Pain? No/denies    Pain Score 0-No pain    Pain Onset More than a month ago                             [facility] Adult PT Treatment/Exercise - 07/27/20 0001      Shoulder Exercises: Prone   Other Prone Exercises push up hold c reactive cone touching 30 sec x 5 birddog position c 1.5 lbs on Lt UE      Shoulder Exercises: Standing   Other Standing Exercises 90/90 body rotation stabilization 5 lb kettle bell 15x    Other Standing Exercises reverse flys red band 20 x      Shoulder Exercises: ROM/Strengthening   UBE (Upper Arm Bike) Lvl 4 5 mins fwd/back each c 10 seconds intervals each minute    Lat Pull Other (comment)   3 x 10 45 lb   Cybex  Row Other (comment)    3 x 10 45 lbs                      PT Long Term Goals - 07/24/20 1331      PT LONG TERM GOAL #1   Title Patient will demonstrate/report pain at worst less than or equal to 2/10 to facilitate minimal limitation in daily activity secondary to pain symptoms.    Time 6    Period Weeks    Status On-going    Target Date 08/09/20      PT LONG TERM GOAL #2   Title Patient will demonstrate independent use of home exercise program to facilitate ability to maintain/progress functional gains from skilled physical therapy services.    Time 6    Period Weeks    Status Achieved      PT LONG TERM GOAL #3   Title Patient will demonstrate Lt GH joint mobility WFL to facilitate usual self care, dressing, reaching overhead at PLOF s limitation due to  symptoms.    Time 6    Period Weeks    Status On-going    Target Date 08/09/20      PT LONG TERM GOAL #4   Title Patient will demonstrate Lt UE MMT 5/5 throughout to facilitate usual lifting, carrying in functional activity to PLOF s limitation.    Time 6    Period Weeks    Status On-going    Target Date 08/09/20      PT LONG TERM GOAL #5   Title Pt. will demonstrate/report ability to return work at [employer].    Time 6    Period Weeks    Status On-going    Target Date 08/09/20                 Plan - 07/27/20 1416    Clinical Impression Statement Stabilization focus performed today c good overall tolerance and control, but fatigue noted.  Pt. continued to show progress overall towards long term goals of symptoms reduction and strength.    Personal Factors and Comorbidities Other;Fitness   History of shoulder dislocation   Examination-Activity Limitations Bathing;Sleep;Carry;Lift;Reach Overhead    Examination-Participation Restrictions Cleaning;Community Activity;Driving    Stability/Clinical Decision Making Stable/Uncomplicated    Rehab Potential Good    PT Frequency 2x / week    PT Duration 6 weeks    PT Treatment/Interventions ADLs/Self Care Home Management;Electrical Stimulation;Iontophoresis 4mg /ml Dexamethasone;Moist Heat;Traction;Balance training;Therapeutic exercise;Therapeutic activities;Functional mobility training;Ultrasound;Neuromuscular re-education;Manual techniques;Vasopneumatic Device;Taping;Dry needling;Passive range of motion;Spinal Manipulations;Joint Manipulations    PT Next Visit Plan Overhead reaching strength training , control improvement    PT Home Exercise Plan [id number]    Consulted and Agree with Plan of Care Patient           Patient will benefit from skilled therapeutic intervention in order to improve the following deficits and impairments:  Decreased endurance, Pain, Impaired UE functional use, Decreased strength, Decreased activity tolerance,  Increased edema, Decreased mobility, Increased muscle spasms, Impaired perceived functional ability, Decreased coordination  Visit Diagnosis: Chronic left shoulder pain  Stiffness of left shoulder, not elsewhere classified  Muscle weakness (generalized)     Problem List Patient Active Problem List   Diagnosis Date Noted  . 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

## 2020-07-30 ENCOUNTER — Encounter: Payer: BC Managed Care – PPO | Admitting: Rehabilitative and Restorative Service Providers"

## 2020-07-30 ENCOUNTER — Telehealth: Payer: Self-pay | Admitting: Rehabilitative and Restorative Service Providers"

## 2020-07-30 NOTE — Telephone Encounter (Signed)
Called and received busy signal.  No show for appointment today.  Chyrel Masson, PT, DPT, OCS, ATC 07/30/20  1:24 PM

## 2020-08-02 ENCOUNTER — Other Ambulatory Visit: Payer: Self-pay

## 2020-08-02 ENCOUNTER — Ambulatory Visit (INDEPENDENT_AMBULATORY_CARE_PROVIDER_SITE_OTHER): Payer: BC Managed Care – PPO | Admitting: Rehabilitative and Restorative Service Providers"

## 2020-08-02 ENCOUNTER — Encounter: Payer: Self-pay | Admitting: Rehabilitative and Restorative Service Providers"

## 2020-08-02 DIAGNOSIS — M6281 Muscle weakness (generalized): Secondary | ICD-10-CM | POA: Diagnosis not present

## 2020-08-02 DIAGNOSIS — G8929 Other chronic pain: Secondary | ICD-10-CM | POA: Diagnosis not present

## 2020-08-02 DIAGNOSIS — M25612 Stiffness of left shoulder, not elsewhere classified: Secondary | ICD-10-CM

## 2020-08-02 DIAGNOSIS — M25512 Pain in left shoulder: Secondary | ICD-10-CM | POA: Diagnosis not present

## 2020-08-02 NOTE — Therapy (Signed)
[facility] Physical Therapy [address] [place], [place], [address] Phone: [phone]   Fax:  [phone]  Physical Therapy Treatment  Patient Details  Name: Douglas Dixon MRN: [id number] Date of Birth: [birth year] Referring Provider (PT): Dr. 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    Past Surgical History:  Procedure Laterality Date   CHONDROPLASTY Left 05/22/2016   Procedure: CHONDROPLASTY;  Surgeon: Loreta Ave, MD;  Location: [facility];  Service: Orthopedics;  Laterality: Left;   INGUINAL HERNIA REPAIR     SHOULDER ARTHROSCOPY WITH BANKART REPAIR Left 05/22/2016   Procedure: LEFT SHOULDER ARTHROSCOPY WITH DEBRIDEMENT, BANKART REPAIR, EXCISION OF LOOSE BODY, CHONDROPLASTY;  Surgeon: Loreta Ave, MD;  Location: [facility];  Service: Orthopedics;  Laterality: Left;   SHOULDER ARTHROSCOPY WITH ROTATOR CUFF REPAIR AND OPEN BICEPS TENODESIS Left 04/18/2020   Procedure: Left Shoulder Arthroscopy, Biceps Release,  Open Tenodesis;  Surgeon: Cammy Copa, MD;  Location: [facility];  Service: Orthopedics;  Laterality: Left;   SHOULDER LATERJET Left 04/18/2020   Procedure:  Left Shoulder Laterjet;  Surgeon: Cammy Copa, MD;  Location: [facility];  Service: Orthopedics;  Laterality: Left;   THYROGLOSSAL DUCT CYST     TONSILLECTOMY AND ADENOIDECTOMY  06/11/2001    There were no vitals filed for this visit.   Subjective Assessment - 08/02/20 1309    Subjective No pain indicated today.    Patient Stated Goals Return to work, usual recreational activity.    Currently in Pain? No/denies    Pain Score 0-No pain    Pain Location Shoulder    Pain Orientation Left    Pain Onset More than a month ago                             [facility] Adult PT Treatment/Exercise - 08/02/20 0001      Shoulder Exercises: Seated   Other Seated Exercises kneeling eccentric reactive ball toss in 90/90 90 secs x 2      Shoulder Exercises: Standing   Other Standing Exercises 90/90 body rotation stabilization 5 lb kettle bell 20x, 3 point lateral band pulls x 15 each    Other Standing Exercises baps board screw on/off circles 2 mins x 2      Shoulder Exercises: ROM/Strengthening   UBE (Upper Arm Bike) Lvl 4 intervals 45 steady, 15 sec hard 5 mins fwd/back each way  PT Long Term Goals - 07/24/20 1331      PT LONG TERM GOAL #1   Title Patient will demonstrate/report pain at worst less than or equal to 2/10 to facilitate minimal limitation in daily activity secondary to pain symptoms.    Time 6    Period Weeks    Status On-going    Target Date 08/09/20      PT LONG TERM GOAL #2   Title Patient will demonstrate independent use of home exercise program to facilitate ability to maintain/progress functional gains from skilled physical therapy services.    Time 6    Period Weeks    Status Achieved      PT LONG TERM GOAL #3   Title Patient will demonstrate Lt GH joint mobility WFL to facilitate usual self care, dressing, reaching overhead at PLOF s limitation due to symptoms.    Time 6    Period Weeks     Status On-going    Target Date 08/09/20      PT LONG TERM GOAL #4   Title Patient will demonstrate Lt UE MMT 5/5 throughout to facilitate usual lifting, carrying in functional activity to PLOF s limitation.    Time 6    Period Weeks    Status On-going    Target Date 08/09/20      PT LONG TERM GOAL #5   Title Pt. will demonstrate/report ability to return work at [employer].    Time 6    Period Weeks    Status On-going    Target Date 08/09/20                 Plan - 08/02/20 1333    Clinical Impression Statement Work today on shoulder height and above stability and strength performed c fair to good control c no indications of pain reported.  Making gains overall towards full return to work activity but fatigue still noted c repetitive activity such as oil filter twist replications    Personal Factors and Comorbidities Other;Fitness   History of shoulder dislocation   Examination-Activity Limitations Bathing;Sleep;Carry;Lift;Reach Overhead    Examination-Participation Restrictions Cleaning;Community Activity;Driving    Stability/Clinical Decision Making Stable/Uncomplicated    Rehab Potential Good    PT Frequency 2x / week    PT Duration 6 weeks    PT Treatment/Interventions ADLs/Self Care Home Management;Electrical Stimulation;Iontophoresis 4mg /ml Dexamethasone;Moist Heat;Traction;Balance training;Therapeutic exercise;Therapeutic activities;Functional mobility training;Ultrasound;Neuromuscular re-education;Manual techniques;Vasopneumatic Device;Taping;Dry needling;Passive range of motion;Spinal Manipulations;Joint Manipulations    PT Next Visit Plan Overhead reaching strength training , control improvement    PT Home Exercise Plan [id number]    Consulted and Agree with Plan of Care Patient           Patient will benefit from skilled therapeutic intervention in order to improve the following deficits and impairments:  Decreased endurance, Pain, Impaired UE functional use, Decreased  strength, Decreased activity tolerance, Increased edema, Decreased mobility, Increased muscle spasms, Impaired perceived functional ability, Decreased coordination  Visit Diagnosis: Chronic left shoulder pain  Stiffness of left shoulder, not elsewhere classified  Muscle weakness (generalized)     Problem List Patient Active Problem List   Diagnosis Date Noted   deformity, left 05/22/2016    07/22/2016, PT, DPT, OCS, ATC 08/02/20  1:41 PM    [facility] Physical Therapy [address] [place], [place], [place] Phone: [phone]   Fax:  [phone]  Name: Douglas Dixon MRN: Beatrice Lecher Date of Birth: [birth year]

## 2020-08-06 ENCOUNTER — Encounter: Payer: BC Managed Care – PPO | Admitting: Rehabilitative and Restorative Service Providers"

## 2020-08-09 ENCOUNTER — Ambulatory Visit (INDEPENDENT_AMBULATORY_CARE_PROVIDER_SITE_OTHER): Payer: BC Managed Care – PPO | Admitting: Rehabilitative and Restorative Service Providers"

## 2020-08-09 ENCOUNTER — Encounter: Payer: Self-pay | Admitting: Rehabilitative and Restorative Service Providers"

## 2020-08-09 ENCOUNTER — Other Ambulatory Visit: Payer: Self-pay

## 2020-08-09 DIAGNOSIS — G8929 Other chronic pain: Secondary | ICD-10-CM | POA: Diagnosis not present

## 2020-08-09 DIAGNOSIS — M6281 Muscle weakness (generalized): Secondary | ICD-10-CM

## 2020-08-09 DIAGNOSIS — M25612 Stiffness of left shoulder, not elsewhere classified: Secondary | ICD-10-CM

## 2020-08-09 DIAGNOSIS — M25512 Pain in left shoulder: Secondary | ICD-10-CM | POA: Diagnosis not present

## 2020-08-09 NOTE — Therapy (Signed)
[facility] Physical Therapy [address] [place], [place], [address] Phone: [phone]   Fax:  [phone]  Physical Therapy Treatment/Progress Note/Recert Patient Details  Name: Douglas Dixon MRN: [id number] Date of Birth: 1996-10-25 Referring Provider (PT): Dr. August Saucer   Encounter Date: 08/09/2020   Progress Note Reporting Period 06/28/2020 to 08/09/2020  See note below for Objective Data and Assessment of Progress/Goals.        PT End of Session - 08/09/20 1147    Visit Number 15    Number of Visits 18    Date for PT Re-Evaluation 09/06/20    Authorization Type 30 visit limit per year    Authorization Time Period 08/09/2020-09/06/2020    Progress Note Due on Visit 25    PT Start Time 1144    PT Stop Time 1225    PT Time Calculation (min) 41 min    Activity Tolerance Patient tolerated treatment well    Behavior During Therapy WFL for tasks assessed/performed           Past Medical History:  Diagnosis Date  . 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 Loose body in joint of shoulder region 04/2016   left  . Migraines   . Shoulder joint dislocation 04/2016   left  . Stuffy and runny nose 05/15/2016   clear drainage from nose, per pt.    Past Surgical History:  Procedure Laterality Date  . CHONDROPLASTY Left 05/22/2016   Procedure: CHONDROPLASTY;  Surgeon: Loreta Ave, MD;  Location: [facility];  Service: Orthopedics;  Laterality: Left;  . INGUINAL HERNIA REPAIR    . SHOULDER ARTHROSCOPY WITH BANKART REPAIR Left 05/22/2016   Procedure: LEFT SHOULDER ARTHROSCOPY WITH DEBRIDEMENT, BANKART REPAIR, EXCISION OF LOOSE BODY, CHONDROPLASTY;  Surgeon: Loreta Ave, MD;  Location: [facility];  Service: Orthopedics;  Laterality: Left;  . SHOULDER ARTHROSCOPY WITH ROTATOR CUFF REPAIR AND OPEN BICEPS TENODESIS Left 04/18/2020   Procedure: Left Shoulder Arthroscopy, Biceps Release,  Open Tenodesis;  Surgeon:  Cammy Copa, MD;  Location: [facility];  Service: Orthopedics;  Laterality: Left;  . SHOULDER LATERJET Left 04/18/2020   Procedure: Left Shoulder Laterjet;  Surgeon: Cammy Copa, MD;  Location: [facility];  Service: Orthopedics;  Laterality: Left;  . THYROGLOSSAL DUCT CYST    . TONSILLECTOMY AND ADENOIDECTOMY  06/11/2001    There were no vitals filed for this visit.   Subjective Assessment - 08/09/20 1146    Subjective Pt. indicated no pain really.  Some trouble mild c quick movements indicated.    Patient Stated Goals Return to work, usual recreational activity.    Currently in Pain? No/denies    Pain Score 0-No pain    Pain Onset More than a month ago              [facility] PT Assessment - 08/09/20 0001      Assessment   Medical Diagnosis S/P Lt shoulder biceps release    Referring Provider (PT) Dr. August Saucer    Onset Date/Surgical Date 04/18/20    Hand Dominance Right      AROM   Overall AROM Comments Functional in flexion, abd, ir AROM.  HBB to T12 Lt    Left Shoulder External Rotation 75 Degrees   measured in 90 deg abd     Strength   Left Shoulder Flexion 5/5    Left Shoulder ABduction 4+/5    Left Shoulder Internal Rotation 5/5  Left Shoulder External Rotation 4+/5                         [facility] Adult PT Treatment/Exercise - 08/09/20 0001      Neuro Re-ed    Neuro Re-ed Details  pball 90 deg flexion hold c external perts30 sec x4, wall push up on pball for stability 3x 10      Shoulder Exercises: Standing   Horizontal ABduction Both;Other (comment)   3 x 15   Theraband Level (Shoulder Horizontal ABduction) Level 2 (Red)    External Rotation Strengthening;Left;Other (comment)   blue 3 x 10 c punch and body rotation to 90/90   Theraband Level (Shoulder External Rotation) Level 4 (Blue)    Other Standing Exercises body blade er/ir 30 sec x 4, flexion 90 deg 30 sec x 2, abd 90 deg 30 sec x 2      Shoulder  Exercises: ROM/Strengthening   UBE (Upper Arm Bike) Lvl 4 intervals 45 steady, 15 sec hard 5 mins fwd/back each way                       PT Long Term Goals - 08/09/20 1153      PT LONG TERM GOAL #1   Title Patient will demonstrate/report pain at worst less than or equal to 2/10 to facilitate minimal limitation in daily activity secondary to pain symptoms.    Time 6    Period Weeks    Status Achieved      PT LONG TERM GOAL #2   Title Patient will demonstrate independent use of home exercise program to facilitate ability to maintain/progress functional gains from skilled physical therapy services.    Time 6    Period Weeks    Status Achieved      PT LONG TERM GOAL #3   Title Patient will demonstrate Lt GH joint mobility WFL to facilitate usual self care, dressing, reaching overhead at PLOF s limitation due to symptoms.    Time 6    Period Weeks    Status On-going    Target Date 09/06/20      PT LONG TERM GOAL #4   Title Patient will demonstrate Lt UE MMT 5/5 throughout to facilitate usual lifting, carrying in functional activity to PLOF s limitation.    Time 6    Period Weeks    Status On-going    Target Date 09/06/20      PT LONG TERM GOAL #5   Title Pt. will demonstrate/report ability to return work at [employer].    Time 6    Period Weeks    Status On-going    Target Date 09/06/20                 Plan - 08/09/20 1151    Clinical Impression Statement Dio has continued to show good overall improvement in presentation compared to last progress update.  See objective data for updated information showing progress.  Pt. may continue to benefit from graded reduction in clinic visits c transitioning to HEP as appropraite pending strength gains.  Pt. in aggreement with plan.    Personal Factors and Comorbidities Other;Fitness   History of shoulder dislocation   Examination-Activity Limitations Bathing;Sleep;Carry;Lift;Reach Overhead     Examination-Participation Restrictions Cleaning;Community Activity;Driving    Stability/Clinical Decision Making Stable/Uncomplicated    Rehab Potential Good    PT Frequency 1x / week    PT Duration 4 weeks  PT Treatment/Interventions ADLs/Self Care Home Management;Electrical Stimulation;Iontophoresis 4mg /ml Dexamethasone;Moist Heat;Traction;Balance training;Therapeutic exercise;Therapeutic activities;Functional mobility training;Ultrasound;Neuromuscular re-education;Manual techniques;Vasopneumatic Device;Taping;Dry needling;Passive range of motion;Spinal Manipulations;Joint Manipulations    PT Next Visit Plan 1x/week for 3-4 weeks.  Continue overhead strengthening/control improvements    PT Home Exercise Plan [id number]    Consulted and Agree with Plan of Care Patient           Patient will benefit from skilled therapeutic intervention in order to improve the following deficits and impairments:  Decreased endurance, Pain, Impaired UE functional use, Decreased strength, Decreased activity tolerance, Increased edema, Decreased mobility, Increased muscle spasms, Impaired perceived functional ability, Decreased coordination  Visit Diagnosis: Chronic left shoulder pain  Stiffness of left shoulder, not elsewhere classified  Muscle weakness (generalized)     Problem List Patient Active Problem List   Diagnosis Date Noted  . 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 [place], [place], [place] Phone: [phone]   Fax:  [phone]  Name: NICOLAI LABONTE MRN: Beatrice Lecher Date of Birth: Feb 11, 1996

## 2020-08-29 ENCOUNTER — Ambulatory Visit: Payer: Self-pay

## 2020-08-29 ENCOUNTER — Encounter: Payer: Self-pay | Admitting: Orthopedic Surgery

## 2020-08-29 ENCOUNTER — Ambulatory Visit (INDEPENDENT_AMBULATORY_CARE_PROVIDER_SITE_OTHER): Payer: BC Managed Care – PPO | Admitting: Orthopedic Surgery

## 2020-08-29 DIAGNOSIS — M25312 Other instability, left shoulder: Secondary | ICD-10-CM

## 2020-08-29 DIAGNOSIS — M25512 Pain in left shoulder: Secondary | ICD-10-CM

## 2020-08-29 NOTE — Progress Notes (Signed)
Office Visit Note   Patient: Douglas Dixon           Date of Birth: 1996-10-14           MRN: [id number] Visit Date: 08/29/2020 Requested by: Gareth Morgan, MD [address]  [place] [address] PCP: Gareth Morgan, MD  Subjective: Chief Complaint  Patient presents with  . Left Shoulder - Follow-up    HPI: Douglas Dixon is a 24 y.o. male who presents to the office s/p left shoulder latarjet procedure.  He is about 4 and half months out.  States that he is doing well overall.  His shoulder "feels normal 95% of the time".  He notes occasional sharp pain anteriorly with reaching out away from his body.  Overall he is doing well and he has no report of any subluxation/dislocation events.  Pain does not wake him up at night.  He does not take any medications for pain.  He is able to sleep on his left shoulder.  He works as a Curator.  He has occasional soreness in both shoulders and his nonoperative shoulder actually bothers him more than his operative shoulder.  He does note occasional numbness and tingling in the fourth and fifth fingers when he wakes up some morning.  This has been a recurring problem since he first began dislocating his shoulder in 2017.  This is no worse recently.                ROS: All systems reviewed are negative as they relate to the chief complaint within the history of present illness.  Patient denies fevers or chills.  Assessment & Plan: Visit Diagnoses:  1. Left shoulder pain, unspecified chronicity   2. Instability of left shoulder joint     Plan: Patient is a 24 year old male who presents s/p left shoulder laterjet procedure.  He is about 4 months out.  Doing well without any dislocation events.  Excellent range of motion.  No lingering issues.  He does have occasional sharp pain but pain is not causing him significant distress and his daily life.  Radiographs taken today show bone graft in good position without any compromise of the hardware.   Plan for patient to return to work as a Curator.  Avoid overhead lifting.3 month return for final check.   Follow-Up Instructions: No follow-ups on file.   Orders:  Orders Placed This Encounter  Procedures  . XR Shoulder Left   No orders of the defined types were placed in this encounter.     Procedures: No procedures performed   Clinical Data: No additional findings.  Objective: Vital Signs: There were no vitals taken for this visit.  Physical Exam:  Constitutional: Patient appears well-developed HEENT:  Head: Normocephalic Eyes:EOM are normal Neck: Normal range of motion Cardiovascular: Normal rate Pulmonary/chest: Effort normal Neurologic: Patient is alert Skin: Skin is warm Psychiatric: Patient has normal mood and affect  Ortho Exam: Ortho exam demonstrates left shoulder with 50 degrees external rotation, 110 degrees abduction, 180 degrees forward flexion.  Excellent strength of the supraspinatus, infraspinatus, subscapularis.  Negative apprehension sign.  No significant posterior laxity compared with contralateral side.  Mild anterior laxity with solid endpoint.  Incision is well-healed without any evidence of infection or dehiscence.  Left elbow with negative Tinel test.  Negative elbow flexion test.  No subluxing ulnar nerve.  Negative Froment's sign.  Excellent strength of finger abduction.  Specialty Comments:  No specialty comments available.  Imaging:  No results found.   PMFS History: Patient Active Problem List   Diagnosis Date Noted  . 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 Loose body in joint of shoulder region 04/2016   left  . Migraines   . Shoulder joint dislocation 04/2016   left  . Stuffy and runny nose 05/15/2016   clear drainage from nose, per pt.    No family history on file.  Past Surgical History:  Procedure Laterality Date  .  CHONDROPLASTY Left 05/22/2016   Procedure: CHONDROPLASTY;  Surgeon: Loreta Ave, MD;  Location: [facility];  Service: Orthopedics;  Laterality: Left;  . INGUINAL HERNIA REPAIR    . SHOULDER ARTHROSCOPY WITH BANKART REPAIR Left 05/22/2016   Procedure: LEFT SHOULDER ARTHROSCOPY WITH DEBRIDEMENT, BANKART REPAIR, EXCISION OF LOOSE BODY, CHONDROPLASTY;  Surgeon: Loreta Ave, MD;  Location: [facility];  Service: Orthopedics;  Laterality: Left;  . SHOULDER ARTHROSCOPY WITH ROTATOR CUFF REPAIR AND OPEN BICEPS TENODESIS Left 04/18/2020   Procedure: Left Shoulder Arthroscopy, Biceps Release,  Open Tenodesis;  Surgeon: Cammy Copa, MD;  Location: [facility];  Service: Orthopedics;  Laterality: Left;  . SHOULDER LATERJET Left 04/18/2020   Procedure: Left Shoulder Laterjet;  Surgeon: Cammy Copa, MD;  Location: [facility];  Service: Orthopedics;  Laterality: Left;  . THYROGLOSSAL DUCT CYST    . TONSILLECTOMY AND ADENOIDECTOMY  06/11/2001   Social History   Occupational History  . Not on file  Tobacco Use  . Smoking status: Never Smoker  . Smokeless tobacco: Never Used  Substance and Sexual Activity  . Alcohol use: Yes    Comment: occasionally  . Drug use: No  . Sexual activity: Not on file

## 2020-09-07 ENCOUNTER — Encounter: Payer: Self-pay | Admitting: Rehabilitative and Restorative Service Providers"

## 2020-09-07 ENCOUNTER — Ambulatory Visit (INDEPENDENT_AMBULATORY_CARE_PROVIDER_SITE_OTHER): Payer: BC Managed Care – PPO | Admitting: Rehabilitative and Restorative Service Providers"

## 2020-09-07 ENCOUNTER — Other Ambulatory Visit: Payer: Self-pay

## 2020-09-07 DIAGNOSIS — M25612 Stiffness of left shoulder, not elsewhere classified: Secondary | ICD-10-CM | POA: Diagnosis not present

## 2020-09-07 DIAGNOSIS — M6281 Muscle weakness (generalized): Secondary | ICD-10-CM

## 2020-09-07 DIAGNOSIS — G8929 Other chronic pain: Secondary | ICD-10-CM

## 2020-09-07 DIAGNOSIS — M25512 Pain in left shoulder: Secondary | ICD-10-CM | POA: Diagnosis not present

## 2020-09-07 NOTE — Therapy (Signed)
[facility] [address] [place], [place], [address] Phone: [phone]   Fax:  [phone]  Physical Therapy Treatment/Recertification  Patient Details  Name: Douglas Dixon MRN: [id number] Date of Birth: 1995/12/30 Referring Provider (PT): Dr. August Saucer   Encounter Date: 09/07/2020  Progress Note Reporting Period 08/09/2020 to 09/07/2020  See note below for Objective Data and Assessment of Progress/Goals.        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    Past Surgical History:  Procedure Laterality Date   CHONDROPLASTY Left 05/22/2016   Procedure: CHONDROPLASTY;  Surgeon: Loreta Ave, MD;  Location: [facility];  Service: Orthopedics;  Laterality: Left;   INGUINAL HERNIA REPAIR     SHOULDER ARTHROSCOPY WITH BANKART REPAIR Left 05/22/2016   Procedure: LEFT SHOULDER ARTHROSCOPY WITH DEBRIDEMENT, BANKART REPAIR, EXCISION OF LOOSE BODY, CHONDROPLASTY;  Surgeon: Loreta Ave, MD;  Location: [facility];  Service: Orthopedics;  Laterality: Left;   SHOULDER ARTHROSCOPY WITH ROTATOR CUFF REPAIR AND OPEN BICEPS TENODESIS Left 04/18/2020   Procedure: Left Shoulder Arthroscopy, Biceps Release,  Open Tenodesis;  Surgeon: Cammy Copa, MD;  Location: [facility];  Service: Orthopedics;  Laterality: Left;   SHOULDER LATERJET Left 04/18/2020   Procedure: Left Shoulder Laterjet;  Surgeon: Cammy Copa, MD;  Location: [facility];  Service: Orthopedics;  Laterality: Left;   THYROGLOSSAL DUCT CYST     TONSILLECTOMY AND ADENOIDECTOMY  06/11/2001    There were no vitals filed for this visit.   Subjective Assessment - 09/07/20 0850    Subjective Pt. stated no specific sharp pains.  Pt. stated having some soreness from workouts at times.  Pt. saw MD and indicated things were going well. Pt. returned to work with still avoiding overhead lifting.    Patient Stated Goals Return to work, usual recreational activity.    Currently in Pain? No/denies    Pain Score 0-No pain    Pain Onset More than a month ago              [facility] PT Assessment - 09/07/20 0001      Assessment   Medical Diagnosis S/P Lt shoulder biceps release    Referring Provider (PT) Dr. August Saucer    Onset Date/Surgical Date 04/18/20    Hand Dominance Right      AROM   Overall AROM Comments Functional in flexion, abd, ir AROM.  HBB to T12 Lt    Left Shoulder External Rotation 75 Degrees      Strength   Left Shoulder Flexion 5/5    Left Shoulder ABduction 4+/5  Left Shoulder Internal Rotation 5/5    Left Shoulder External Rotation 4+/5                         [facility] Adult PT Treatment/Exercise - 09/07/20 0001      Shoulder Exercises: Prone   Other Prone Exercises lower trap scaption lift in full prone bilateral 3 x 10 2 lb, push up on knees c bosu x 20, bosu ball rolls in push up on knees 1 min x 3    Other Prone Exercises scap retraction c GH ext x 15      Shoulder Exercises: Standing   Diagonals Left   blue 3 x 15   Other Standing Exercises 90/90 body rotation stabilization 5 lb kettle bell 15 x 2      Shoulder Exercises: ROM/Strengthening   UBE (Upper Arm Bike) Lvl 4  5 mins fwd/back each way 10 mins total                        PT Long Term Goals - 09/07/20 0854      PT LONG TERM GOAL #1   Title Patient will demonstrate/report pain at worst less than or equal to 2/10 to facilitate minimal limitation in daily activity secondary to pain symptoms.    Time 6    Period Weeks    Status Achieved      PT LONG TERM GOAL #2   Title Patient will demonstrate independent use of home exercise program to facilitate ability to maintain/progress functional gains from skilled physical therapy services.    Time 6    Period Weeks    Status Achieved      PT LONG TERM GOAL #3   Title Patient will demonstrate Lt GH joint mobility WFL to facilitate usual self care, dressing, reaching overhead at PLOF s limitation due to symptoms.    Time 6    Period Weeks    Status On-going    Target Date 10/05/20      PT LONG TERM GOAL #4   Title Patient will demonstrate Lt UE MMT 5/5 throughout to facilitate usual lifting, carrying in functional activity to PLOF s limitation.    Time 6    Period Weeks    Status On-going    Target Date 10/05/20      PT LONG TERM GOAL #5   Title Pt. will demonstrate/report ability to return work at [employer].    Baseline 09/07/2020: return to work but limiting overhead lifting    Time 6    Period Weeks    Status On-going    Target Date 10/05/20                 Plan - 09/07/20 0853    Clinical Impression Statement Pt. returned today after several weeks of HEP and a follow up MD visit, indicating good results overall.  Pt. was unable to attend in last few weeks as previously discussed due to scheduling conflicts.  Pt. plan to be updated today to allow for 1-2 more visits over next 4 weeks to allow continued transition to HEP and continued strengthening towards full goals and return to PLOF.    Personal Factors and Comorbidities Other;Fitness   History of shoulder dislocation   Examination-Activity Limitations Bathing;Sleep;Carry;Lift;Reach Overhead    Examination-Participation  Restrictions Cleaning;Community Activity;Driving    Stability/Clinical Decision Making Stable/Uncomplicated    Rehab Potential Good    PT Frequency 1x /  week    PT Duration 4 weeks    PT Treatment/Interventions ADLs/Self Care Home Management;Electrical Stimulation;Iontophoresis 4mg /ml Dexamethasone;Moist Heat;Traction;Balance training;Therapeutic exercise;Therapeutic activities;Functional mobility training;Ultrasound;Neuromuscular re-education;Manual techniques;Vasopneumatic Device;Taping;Dry needling;Passive range of motion;Spinal Manipulations;Joint Manipulations    PT Next Visit Plan 1-2x over next 4 weeks c HEP transitioning for strengthening    PT Home Exercise Plan [id number]    Consulted and Agree with Plan of Care Patient           Patient will benefit from skilled therapeutic intervention in order to improve the following deficits and impairments:  Decreased endurance, Pain, Impaired UE functional use, Decreased strength, Decreased activity tolerance, Increased edema, Decreased mobility, Increased muscle spasms, Impaired perceived functional ability, Decreased coordination  Visit Diagnosis: Chronic left shoulder pain  Stiffness of left shoulder, not elsewhere classified  Muscle weakness (generalized)     Problem List Patient Active Problem List   Diagnosis Date Noted   deformity, left 05/22/2016    07/22/2016, PT, DPT, OCS, ATC 09/07/20  9:16 AM    [facility] Physical Therapy [address] [place], [place], [place] Phone: [phone]   Fax:  [phone]  Name: Douglas Dixon MRN: Beatrice Lecher Date of Birth: 11-25-1996

## 2020-09-13 ENCOUNTER — Telehealth: Payer: Self-pay | Admitting: Rehabilitative and Restorative Service Providers"

## 2020-09-13 ENCOUNTER — Encounter: Payer: BC Managed Care – PPO | Admitting: Rehabilitative and Restorative Service Providers"

## 2020-09-13 NOTE — Telephone Encounter (Signed)
Pt. indicated not setting an alarm and forgot appointment.   Chyrel Masson, PT, DPT, OCS, ATC 09/13/20  12:04 PM

## 2020-09-19 ENCOUNTER — Encounter: Payer: Self-pay | Admitting: Rehabilitative and Restorative Service Providers"

## 2020-09-19 ENCOUNTER — Ambulatory Visit (INDEPENDENT_AMBULATORY_CARE_PROVIDER_SITE_OTHER): Payer: BC Managed Care – PPO | Admitting: Rehabilitative and Restorative Service Providers"

## 2020-09-19 ENCOUNTER — Other Ambulatory Visit: Payer: Self-pay

## 2020-09-19 DIAGNOSIS — M25512 Pain in left shoulder: Secondary | ICD-10-CM | POA: Diagnosis not present

## 2020-09-19 DIAGNOSIS — G8929 Other chronic pain: Secondary | ICD-10-CM | POA: Diagnosis not present

## 2020-09-19 DIAGNOSIS — M25612 Stiffness of left shoulder, not elsewhere classified: Secondary | ICD-10-CM

## 2020-09-19 DIAGNOSIS — M6281 Muscle weakness (generalized): Secondary | ICD-10-CM | POA: Diagnosis not present

## 2020-09-19 NOTE — Therapy (Addendum)
[facility] [address] [place], [place], [address] Phone: [phone]   Fax:  [phone]  Physical Therapy Treatment/Discharge  Patient Details  Name: Douglas Dixon MRN: [id number] Date of Birth: 05-Mar-1996 Referring Provider (PT): Dr. Marlou Sa   Encounter Date: 09/19/2020   PT End of Session - 09/19/20 1307    Visit Number 17    Number of Visits 18    Date for PT Re-Evaluation 10/05/20    Authorization Type 30 visit limit per year    Progress Note Due on Visit 25    PT Start Time 1300    PT Stop Time 1340    PT Time Calculation (min) 40 min    Activity Tolerance Patient tolerated treatment well    Behavior During Therapy [facility] for tasks assessed/performed           Past Medical History:  Diagnosis Date  . 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 Loose body in joint of shoulder region 04/2016   left  . Migraines   . Shoulder joint dislocation 04/2016   left  . Stuffy and runny nose 05/15/2016   clear drainage from nose, per pt.    Past Surgical History:  Procedure Laterality Date  . CHONDROPLASTY Left 05/22/2016   Procedure: CHONDROPLASTY;  Surgeon: Ninetta Lights, MD;  Location: [facility];  Service: Orthopedics;  Laterality: Left;  . INGUINAL HERNIA REPAIR    . SHOULDER ARTHROSCOPY WITH BANKART REPAIR Left 05/22/2016   Procedure: LEFT SHOULDER ARTHROSCOPY WITH DEBRIDEMENT, BANKART REPAIR, EXCISION OF LOOSE BODY, CHONDROPLASTY;  Surgeon: Ninetta Lights, MD;  Location: [facility];  Service: Orthopedics;  Laterality: Left;  . SHOULDER ARTHROSCOPY WITH ROTATOR CUFF REPAIR AND OPEN BICEPS TENODESIS Left 04/18/2020   Procedure: Left Shoulder Arthroscopy, Biceps Release,  Open Tenodesis;  Surgeon: Meredith Pel, MD;  Location: [facility];  Service: Orthopedics;  Laterality: Left;  . SHOULDER LATERJET Left 04/18/2020   Procedure: Left Shoulder Laterjet;  Surgeon: Meredith Pel, MD;  Location: [facility];  Service: Orthopedics;  Laterality: Left;  . THYROGLOSSAL DUCT CYST    . TONSILLECTOMY AND ADENOIDECTOMY  06/11/2001    There were no vitals filed for this visit.   Subjective Assessment - 09/19/20 1305    Subjective Pt. indicated doing fairly well.  Pt. stated soreness down some.  Pt. stated trying some push up movements.    Patient Stated Goals Return to work, usual recreational activity.    Currently in Pain? No/denies    Pain Score 0-No pain    Pain Onset More than a month ago                             [facility] Adult PT Treatment/Exercise - 09/19/20 0001      Shoulder Exercises: Prone   Other Prone Exercises prone y, t 2 x 10 bilateral c support arm on bosu    Other Prone Exercises on knees 2 lb ball circles on bosu 1 min x 2 cw, ccw      Shoulder Exercises: Standing   Other Standing Exercises BAPS on wall storage screw/unscrew 2.5 mins      Shoulder Exercises: ROM/Strengthening   UBE (Upper Arm Bike) Lvl 4  5 mins fwd/back each way 10 mins total  PT Long Term Goals - 09/07/20 0854      PT LONG TERM GOAL #1   Title Patient will demonstrate/report pain at worst less than or equal to 2/10 to facilitate minimal limitation in daily activity secondary to pain symptoms.    Time 6    Period Weeks    Status Achieved      PT LONG TERM GOAL #2   Title Patient will demonstrate independent use of home exercise program to facilitate ability to maintain/progress functional gains from skilled physical therapy services.    Time 6    Period Weeks    Status Achieved      PT LONG TERM GOAL #3   Title Patient will demonstrate Lt [place] joint mobility WFL to facilitate usual self care, dressing, reaching overhead at PLOF s limitation due to symptoms.    Time 6    Period Weeks    Status On-going    Target Date 10/05/20      PT LONG TERM GOAL #4   Title Patient will demonstrate Lt  UE MMT 5/5 throughout to facilitate usual lifting, carrying in functional activity to PLOF s limitation.    Time 6    Period Weeks    Status On-going    Target Date 10/05/20      PT LONG TERM GOAL #5   Title Pt. will demonstrate/report ability to return work at [employer].    Baseline 09/07/2020: return to work but limiting overhead lifting    Time 6    Period Weeks    Status On-going    Target Date 10/05/20                 Plan - 09/19/20 1323    Clinical Impression Statement Continued progression in ability to perform WB acceptance and endurance based intervention for UE.    Personal Factors and Comorbidities Other;Fitness   History of shoulder dislocation   Examination-Activity Limitations Bathing;Sleep;Carry;Lift;Reach Overhead    Examination-Participation Restrictions Cleaning;Community Activity;Driving    Stability/Clinical Decision Making Stable/Uncomplicated    Rehab Potential Good    PT Frequency 1x / week    PT Duration 4 weeks    PT Treatment/Interventions ADLs/Self Care Home Management;Electrical Stimulation;Iontophoresis 61m/ml Dexamethasone;Moist Heat;Traction;Balance training;Therapeutic exercise;Therapeutic activities;Functional mobility training;Ultrasound;Neuromuscular re-education;Manual techniques;Vasopneumatic Device;Taping;Dry needling;Passive range of motion;Spinal Manipulations;Joint Manipulations    PT Next Visit Plan 1 more visit c HEP transition    PT Home Exercise Plan [id number]    Consulted and Agree with Plan of Care Patient           Patient will benefit from skilled therapeutic intervention in order to improve the following deficits and impairments:  Decreased endurance, Pain, Impaired UE functional use, Decreased strength, Decreased activity tolerance, Increased edema, Decreased mobility, Increased muscle spasms, Impaired perceived functional ability, Decreased coordination  Visit Diagnosis: Chronic left shoulder pain  Stiffness of left shoulder,  not elsewhere classified  Muscle weakness (generalized)     Problem List Patient Active Problem List   Diagnosis Date Noted  . 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  Patient goals were partially met. Patient is being discharged due to being pleased with the current functional level.  ?????  Scot Jun, PT, DPT, OCS, ATC 10/09/20  10:50 AM      [facility] Physical Therapy [address] [place], [place], [address] Phone: [phone]   Fax:  [phone]  Name: HULBERT BRANSCOME MRN: [id number] Date of Birth: 1996-06-23

## 2020-11-28 ENCOUNTER — Ambulatory Visit (INDEPENDENT_AMBULATORY_CARE_PROVIDER_SITE_OTHER): Payer: BC Managed Care – PPO | Admitting: Orthopedic Surgery

## 2020-11-28 ENCOUNTER — Encounter: Payer: Self-pay | Admitting: Orthopedic Surgery

## 2020-11-28 ENCOUNTER — Ambulatory Visit: Payer: Self-pay

## 2020-11-28 ENCOUNTER — Other Ambulatory Visit: Payer: Self-pay

## 2020-11-28 DIAGNOSIS — M25511 Pain in right shoulder: Secondary | ICD-10-CM | POA: Diagnosis not present

## 2020-11-28 DIAGNOSIS — G8929 Other chronic pain: Secondary | ICD-10-CM | POA: Diagnosis not present

## 2020-11-28 DIAGNOSIS — M25512 Pain in left shoulder: Secondary | ICD-10-CM | POA: Diagnosis not present

## 2020-11-28 NOTE — Progress Notes (Signed)
Office Visit Note   Patient: Douglas Dixon           Date of Birth: August 07, 1996           MRN: [id number] Visit Date: 11/28/2020 Requested by: Gareth Morgan, MD [address]  [place] [address] PCP: Gareth Morgan, MD  Subjective: Chief Complaint  Patient presents with  . Left Shoulder - Follow-up    04/18/2020 left shoulder laterjet procedure    HPI: Douglas Dixon is a 24 y.o. male who presents to the office s/p left shoulder Latarjet procedure on 04/18/2020.  Patient notes that his shoulders been doing very well.  He has been in [place] for the past 50 days returning to [employer] work.  Patient states that his shoulder feels normal below shoulder level but does have some fatigue when he works overhead.  He notes occasional popping while driving.  Denies any dislocation events.  He returned to work as a Curator and last 3 months without any significant difficulty aside from fatigue overhead.  He does not take any medications for pain aside from the occasional ibuprofen.  He notes that his right shoulder actually bothers him more than his operative shoulder.  He describes 3/10 pain in his right shoulder with occasional popping but denies any history of injury or dislocation to the shoulder.  He sleeps well at night on the left shoulder..                ROS: All systems reviewed are negative as they relate to the chief complaint within the history of present illness.  Patient denies fevers or chills.  Assessment & Plan: Visit Diagnoses:  1. Left shoulder pain, unspecified chronicity   2. Chronic right shoulder pain     Plan: Patient is a 24 year old male presents s/p left shoulder Latarjet procedure on 04/18/2020.  He is doing very well aside from some overhead fatigue.  Subscap is intact and functioning well.  No dislocation events and shoulder feels very stable on exam today.  Discussed exercises that he can and cannot do for strength training.  Overall the left shoulder  is doing very well and plan to follow-up as needed regarding the left shoulder.  With the right shoulder, he does have some occasional soreness and pain that bothers him more than his operative shoulder but radiographs taken of the right shoulder today showed no significant degenerative changes or any fracture/dislocation.  Radiographs are unremarkable overall.  Plan for patient to follow-up as needed.  Follow-Up Instructions: No follow-ups on file.   Orders:  Orders Placed This Encounter  Procedures  . XR Shoulder Right   No orders of the defined types were placed in this encounter.     Procedures: No procedures performed   Clinical Data: No additional findings.  Objective: Vital Signs: There were no vitals taken for this visit.  Physical Exam:  Constitutional: Patient appears well-developed HEENT:  Head: Normocephalic Eyes:EOM are normal Neck: Normal range of motion Cardiovascular: Normal rate Pulmonary/chest: Effort normal Neurologic: Patient is alert Skin: Skin is warm Psychiatric: Patient has normal mood and affect  Ortho Exam: Ortho exam demonstrates well-healed incision of the left shoulder..  Excellent subscapularis strength.  Axillary nerve intact with deltoid firing.  Excellent shoulder stability both anteriorly and posteriorly.  70 degrees external rotation, 140 degrees abduction, 180 degrees forward flexion.  Regarding the right shoulder, he has painless range of motion with some popping noted with circumduction.  Excellent rotator cuff strength.  No tenderness over the [facility] joint, bicipital groove.  No stiffness.  Specialty Comments:  No specialty comments available.  Imaging: No results found.   PMFS History: Patient Active Problem List   Diagnosis Date Noted  . 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 Loose body in joint of shoulder region 04/2016    left  . Migraines   . Shoulder joint dislocation 04/2016   left  . Stuffy and runny nose 05/15/2016   clear drainage from nose, per pt.    No family history on file.  Past Surgical History:  Procedure Laterality Date  . CHONDROPLASTY Left 05/22/2016   Procedure: CHONDROPLASTY;  Surgeon: Loreta Ave, MD;  Location: [facility];  Service: Orthopedics;  Laterality: Left;  . INGUINAL HERNIA REPAIR    . SHOULDER ARTHROSCOPY WITH BANKART REPAIR Left 05/22/2016   Procedure: LEFT SHOULDER ARTHROSCOPY WITH DEBRIDEMENT, BANKART REPAIR, EXCISION OF LOOSE BODY, CHONDROPLASTY;  Surgeon: Loreta Ave, MD;  Location: [facility];  Service: Orthopedics;  Laterality: Left;  . SHOULDER ARTHROSCOPY WITH ROTATOR CUFF REPAIR AND OPEN BICEPS TENODESIS Left 04/18/2020   Procedure: Left Shoulder Arthroscopy, Biceps Release,  Open Tenodesis;  Surgeon: Cammy Copa, MD;  Location: [facility];  Service: Orthopedics;  Laterality: Left;  . SHOULDER LATERJET Left 04/18/2020   Procedure: Left Shoulder Laterjet;  Surgeon: Cammy Copa, MD;  Location: [facility];  Service: Orthopedics;  Laterality: Left;  . THYROGLOSSAL DUCT CYST    . TONSILLECTOMY AND ADENOIDECTOMY  06/11/2001   Social History   Occupational History  . Not on file  Tobacco Use  . Smoking status: Never Smoker  . Smokeless tobacco: Never Used  Substance and Sexual Activity  . Alcohol use: Yes    Comment: occasionally  . Drug use: No  . Sexual activity: Not on file

## 2021-08-04 IMAGING — CT CT SHOULDER*L* W/O CM
1 series · 12 of 14 positions shown, 15 images · non-contrast
Comparison: X-ray 01/26/2020

CLINICAL DATA: Shoulder pain, instability, recurrent dislocations

EXAM:
CT OF THE UPPER LEFT EXTREMITY WITHOUT CONTRAST
TECHNIQUE: Multidetector CT imaging of the upper left extremity was performed
according to the standard protocol.

[Series 3: soft tissue · axial · 0.49mm/px · z∈[-259,-87]mm · 12 of 102 slices shown, 15 images]
[im 8/102  soft-tissue]
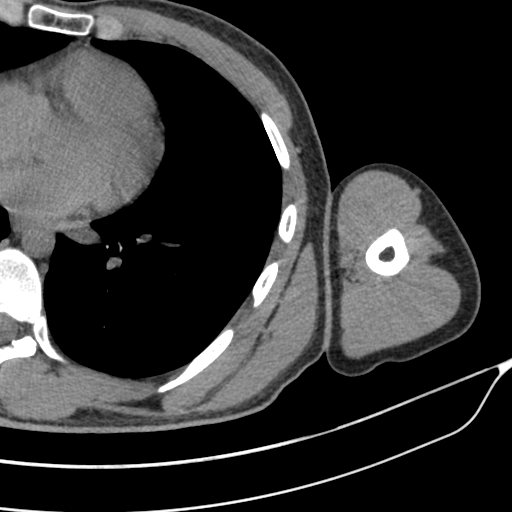
[im 8/102  bone]
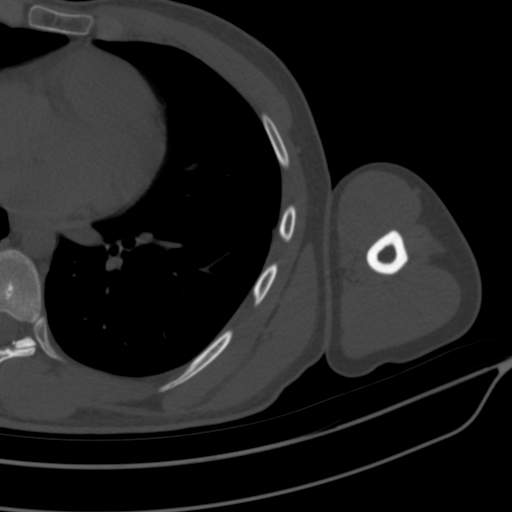
[im 16/102  bone]
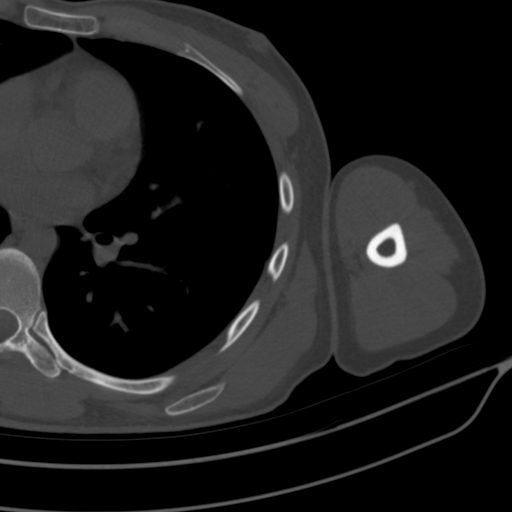
[im 24/102  bone]
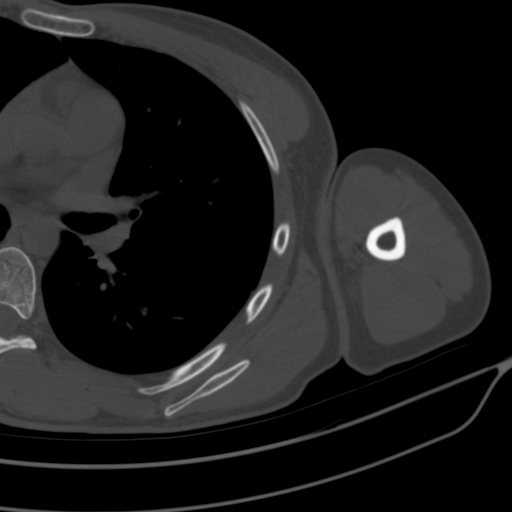
[im 32/102  bone]
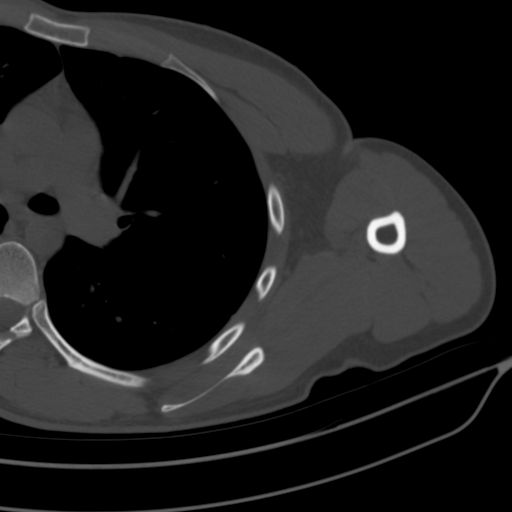
[im 39/102  soft-tissue]
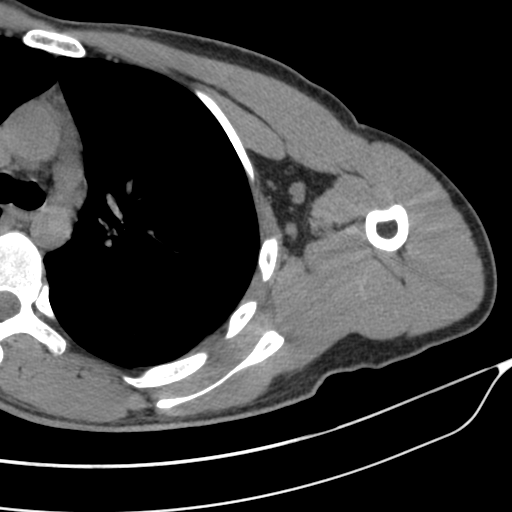
[im 39/102  bone]
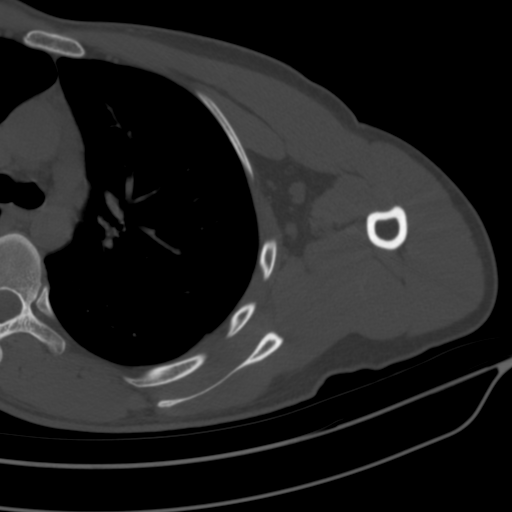
[im 47/102  bone]
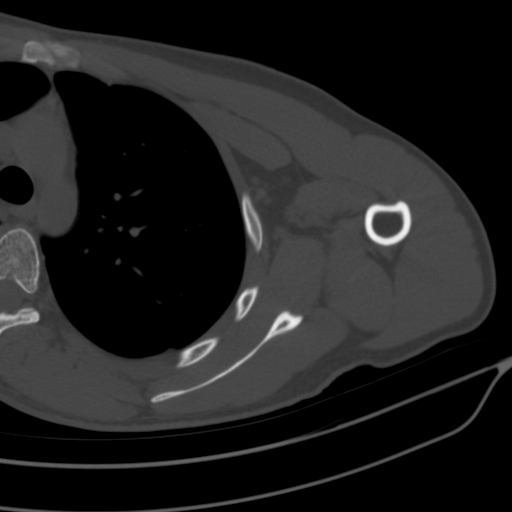
[im 55/102  bone]
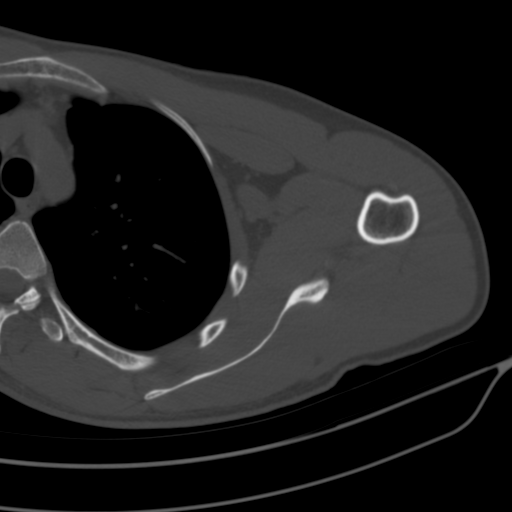
[im 63/102  bone]
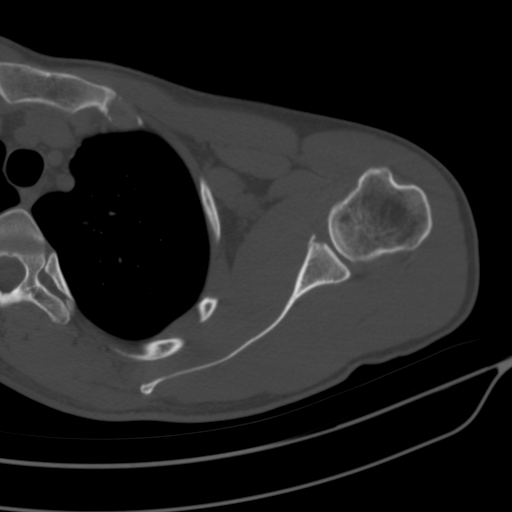
[im 70/102  soft-tissue]
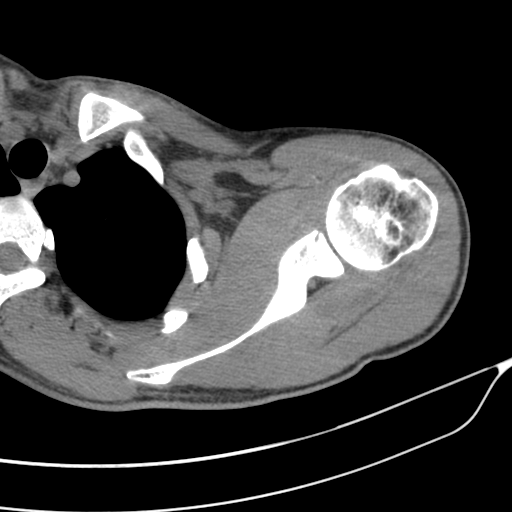
[im 70/102  bone]
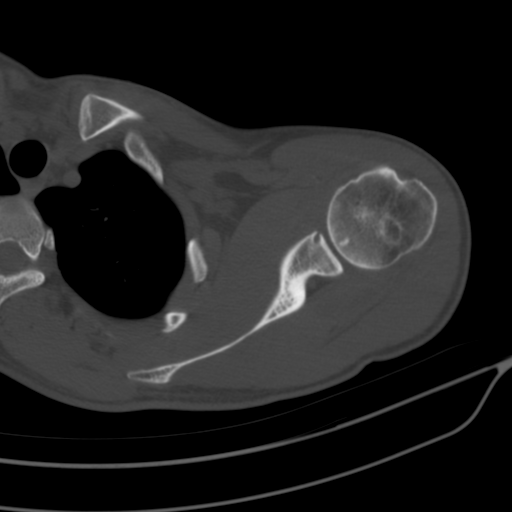
[im 78/102  bone]
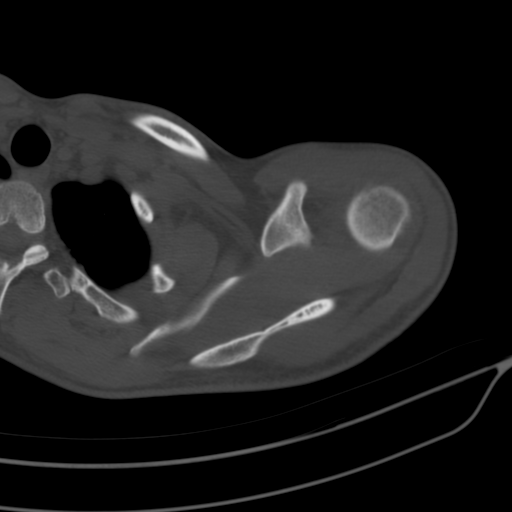
[im 86/102  bone]
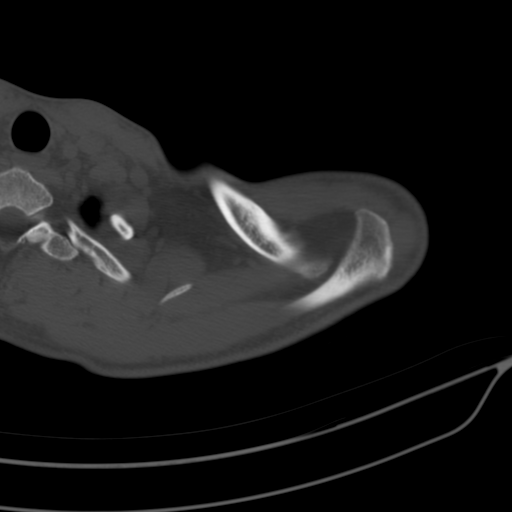
[im 94/102  bone]
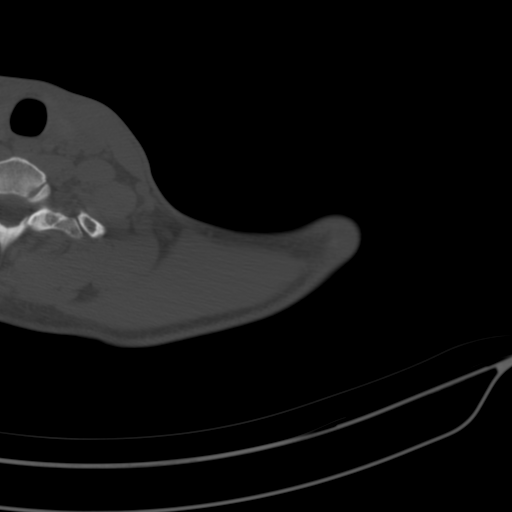

[12 of 14 positions shown; findings below may reference images not displayed]

FINDINGS: Bones/Joint/Cartilage

Mild contour deformity of the superolateral aspect of the humeral
head without associated cortical fracture findings suggest sequela
of prior Hill-Sachs impaction injury. Glenohumeral joint alignment
is maintained without dislocation. Joint space is maintained. There
are at least 2 surgical anchor sites within the anterior glenoid.
Minimally displaced fracture fragment along the anteroinferior
glenoid rim (series 2, image 41; series 8, image 45). Fragment
measures up to 1.5 cm in length. There appears to be a anchor site
within the fragment suggesting chronic fracture and prior repair. No
degenerative cystic changes to the glenoid or humeral head.

AC joint is normal. Remaining visualized osseous structures are
intact and unremarkable.

Ligaments

Suboptimally assessed by CT.

Muscles and Tendons

Normal bulk of the rotator cuff musculature. No large rotator cuff
tendon tear by CT.

Soft tissues

Negative.
IMPRESSION: 1. Findings compatible with history of prior anterior shoulder
dislocations including a chronic appearing mild Hill-Sachs impaction
deformity and bony Bankart with repair.
2. No acute osseous findings or evidence of significant arthropathy.
